# Patient Record
Sex: Female | Born: 1970 | Hispanic: Yes | Marital: Single | Smoking: Never smoker
Health system: Southern US, Community
[De-identification: ages and names within clinical notes are randomized; demographics above are authoritative.]

## PROBLEM LIST (undated history)

## (undated) DIAGNOSIS — K810 Acute cholecystitis: Secondary | ICD-10-CM

## (undated) HISTORY — PX: FINGER SURGERY: SHX640

## (undated) HISTORY — DX: Acute cholecystitis: K81.0

---

## 2007-08-24 ENCOUNTER — Inpatient Hospital Stay: Payer: Self-pay | Admitting: Unknown Physician Specialty

## 2007-08-24 ENCOUNTER — Emergency Department: Payer: Self-pay | Admitting: Emergency Medicine

## 2010-02-19 ENCOUNTER — Ambulatory Visit: Payer: Self-pay | Admitting: Family Medicine

## 2010-10-01 ENCOUNTER — Encounter: Payer: Self-pay | Admitting: Maternal & Fetal Medicine

## 2010-12-28 ENCOUNTER — Inpatient Hospital Stay: Payer: Self-pay

## 2011-12-20 ENCOUNTER — Emergency Department: Payer: Self-pay | Admitting: Emergency Medicine

## 2011-12-20 LAB — HCG, QUANTITATIVE, PREGNANCY: Beta Hcg, Quant.: 952 m[IU]/mL — ABNORMAL HIGH

## 2011-12-23 ENCOUNTER — Emergency Department: Payer: Self-pay | Admitting: Emergency Medicine

## 2011-12-23 LAB — HCG, QUANTITATIVE, PREGNANCY: Beta Hcg, Quant.: 141 m[IU]/mL — ABNORMAL HIGH

## 2016-08-07 ENCOUNTER — Inpatient Hospital Stay
Admission: EM | Admit: 2016-08-07 | Discharge: 2016-08-09 | DRG: 419 | Disposition: A | Discharge status: Home / Self Care | Payer: Self-pay | Attending: Surgery | Admitting: Surgery

## 2016-08-07 ENCOUNTER — Emergency Department: Payer: Self-pay

## 2016-08-07 ENCOUNTER — Encounter: Payer: Self-pay | Admitting: Emergency Medicine

## 2016-08-07 DIAGNOSIS — K8000 Calculus of gallbladder with acute cholecystitis without obstruction: Principal | ICD-10-CM | POA: Diagnosis present

## 2016-08-07 DIAGNOSIS — E669 Obesity, unspecified: Secondary | ICD-10-CM | POA: Diagnosis present

## 2016-08-07 DIAGNOSIS — Z6833 Body mass index (BMI) 33.0-33.9, adult: Secondary | ICD-10-CM

## 2016-08-07 DIAGNOSIS — R109 Unspecified abdominal pain: Secondary | ICD-10-CM

## 2016-08-07 DIAGNOSIS — K810 Acute cholecystitis: Secondary | ICD-10-CM

## 2016-08-07 LAB — COMPREHENSIVE METABOLIC PANEL
ALBUMIN: 4.4 g/dL (ref 3.5–5.0)
ALK PHOS: 80 U/L (ref 38–126)
ALT: 22 U/L (ref 14–54)
ANION GAP: 9 (ref 5–15)
AST: 23 U/L (ref 15–41)
BUN: 10 mg/dL (ref 6–20)
CALCIUM: 9 mg/dL (ref 8.9–10.3)
CHLORIDE: 101 mmol/L (ref 101–111)
CO2: 23 mmol/L (ref 22–32)
CREATININE: 0.54 mg/dL (ref 0.44–1.00)
GFR calc Af Amer: >60 mL/min (ref >60)
GFR calc non Af Amer: >60 mL/min (ref >60)
GLUCOSE: 138 mg/dL — AB (ref 65–99)
Potassium: 3.2 mmol/L — ABNORMAL LOW (ref 3.5–5.1)
SODIUM: 133 mmol/L — AB (ref 135–145)
Total Bilirubin: 0.9 mg/dL (ref 0.3–1.2)
Total Protein: 8.1 g/dL (ref 6.5–8.1)

## 2016-08-07 LAB — CBC
HCT: 38.6 % (ref 35.0–47.0)
HEMOGLOBIN: 14 g/dL (ref 12.0–16.0)
MCH: 31.6 pg (ref 26.0–34.0)
MCHC: 36.4 g/dL — ABNORMAL HIGH (ref 32.0–36.0)
MCV: 86.9 fL (ref 80.0–100.0)
Platelets: 307 10*3/uL (ref 150–440)
RBC: 4.44 MIL/uL (ref 3.80–5.20)
RDW: 14.1 % (ref 11.5–14.5)
WBC: 20.6 10*3/uL — ABNORMAL HIGH (ref 3.6–11.0)

## 2016-08-07 LAB — URINALYSIS, COMPLETE (UACMP) WITH MICROSCOPIC
BACTERIA UA: NONE SEEN
BILIRUBIN URINE: NEGATIVE
GLUCOSE, UA: NEGATIVE mg/dL
KETONES UR: NEGATIVE mg/dL
Leukocytes, UA: NEGATIVE
NITRITE: NEGATIVE
PROTEIN: 30 mg/dL — AB
Specific Gravity, Urine: 1.018 (ref 1.005–1.030)
pH: 6 (ref 5.0–8.0)

## 2016-08-07 LAB — POCT PREGNANCY, URINE: PREG TEST UR: NEGATIVE

## 2016-08-07 LAB — LIPASE, BLOOD: LIPASE: 14 U/L (ref 11–51)

## 2016-08-07 MED ORDER — MORPHINE SULFATE (PF) 4 MG/ML IV SOLN
4.0000 mg | Freq: Once | INTRAVENOUS | Status: AC
  Administered 2016-08-07: 4 mg via INTRAVENOUS

## 2016-08-07 MED ORDER — HEPARIN SODIUM (PORCINE) 5000 UNIT/ML IJ SOLN
5000.0000 [IU] | Freq: Three times a day (TID) | INTRAMUSCULAR | Status: DC
  Administered 2016-08-07 – 2016-08-09 (×5): 5000 [IU] via SUBCUTANEOUS
  Filled 2016-08-07 (×5): qty 1

## 2016-08-07 MED ORDER — MORPHINE SULFATE (PF) 2 MG/ML IV SOLN
2.0000 mg | INTRAVENOUS | Status: DC | PRN
  Administered 2016-08-07 – 2016-08-08 (×4): 2 mg via INTRAVENOUS
  Filled 2016-08-07 (×4): qty 1

## 2016-08-07 MED ORDER — MORPHINE SULFATE (PF) 4 MG/ML IV SOLN
INTRAVENOUS | Status: AC
  Administered 2016-08-07: 4 mg via INTRAVENOUS
  Filled 2016-08-07: qty 1

## 2016-08-07 MED ORDER — CEFAZOLIN SODIUM-DEXTROSE 1-4 GM/50ML-% IV SOLN
INTRAVENOUS | Status: AC
  Administered 2016-08-07: 1 g via INTRAVENOUS
  Filled 2016-08-07: qty 50

## 2016-08-07 MED ORDER — DEXTROSE IN LACTATED RINGERS 5 % IV SOLN
INTRAVENOUS | Status: DC
  Administered 2016-08-07 – 2016-08-09 (×5): via INTRAVENOUS

## 2016-08-07 MED ORDER — ONDANSETRON HCL 4 MG/2ML IJ SOLN
4.0000 mg | Freq: Once | INTRAMUSCULAR | Status: AC
  Administered 2016-08-07: 4 mg via INTRAVENOUS

## 2016-08-07 MED ORDER — CEFAZOLIN SODIUM-DEXTROSE 1-4 GM/50ML-% IV SOLN
1.0000 g | Freq: Four times a day (QID) | INTRAVENOUS | Status: DC
  Administered 2016-08-07 – 2016-08-08 (×4): 1 g via INTRAVENOUS
  Filled 2016-08-07 (×6): qty 50

## 2016-08-07 MED ORDER — ONDANSETRON HCL 4 MG/2ML IJ SOLN
INTRAMUSCULAR | Status: AC
  Administered 2016-08-07: 4 mg via INTRAVENOUS
  Filled 2016-08-07: qty 2

## 2016-08-07 MED ORDER — CEFTRIAXONE SODIUM IN DEXTROSE 20 MG/ML IV SOLN
1.0000 g | Freq: Once | INTRAVENOUS | Status: AC
  Administered 2016-08-07: 1 g via INTRAVENOUS
  Filled 2016-08-07: qty 50

## 2016-08-07 MED ORDER — HYDROCODONE-ACETAMINOPHEN 5-325 MG PO TABS
1.0000 | ORAL_TABLET | ORAL | Status: DC | PRN
  Administered 2016-08-09: 1 via ORAL
  Filled 2016-08-07: qty 1

## 2016-08-07 NOTE — H&P (Signed)
Catherine Kennedy is an 46 y.o. female.    Chief Complaint: Right upper quadrant pain  HPI: This is Spanish-speaking patient who is interviewed with the assistance of a certified interpreter. She describes one day of right upper quadrant pain it started yesterday is been unrelenting and has not improved. She had nausea but no emesis no fevers or chills and no jaundice or acholic stools she has never had this happen before. She has no family history of gallstone disease.  Patient's only surgical history is that of a left hand surgery she does not smoke or drink has no family history of gallbladder disease  History reviewed. No pertinent past medical history.  Past Surgical History:  Procedure Laterality Date  . FINGER SURGERY      No family history on file. Social History:  reports that she has never smoked. She has never used smokeless tobacco. She reports that she does not drink alcohol or use drugs.  Allergies: No Known Allergies   (Not in a hospital admission)   Review of Systems  Constitutional: Negative for chills and fever.  HENT: Negative.   Eyes: Negative.   Respiratory: Negative.   Cardiovascular: Negative.   Gastrointestinal: Positive for abdominal pain and nausea. Negative for blood in stool, constipation, diarrhea, heartburn and vomiting.  Genitourinary: Negative.   Musculoskeletal: Negative.   Skin: Negative.   Neurological: Negative.   Endo/Heme/Allergies: Negative.   Psychiatric/Behavioral: Negative.      Physical Exam:  BP 132/84   Pulse 87   Temp 99.7 F (37.6 C) (Oral)   Resp 18   Ht 4' 11.06" (1.5 m)   Wt 164 lb (74.4 kg)   LMP 08/02/2016 (Approximate)   SpO2 98%   BMI 33.06 kg/m   Physical Exam  Constitutional: She is oriented to person, place, and time and well-developed, well-nourished, and in no distress. No distress.  HENT:  Head: Normocephalic and atraumatic.  Eyes: Pupils are equal, round, and reactive to light. Right eye exhibits  no discharge. Left eye exhibits no discharge. No scleral icterus.  Neck: Normal range of motion.  Cardiovascular: Normal rate, regular rhythm and normal heart sounds.   Pulmonary/Chest: Effort normal. No respiratory distress. She has no wheezes. She has no rales.  Abdominal: Soft. She exhibits no distension. There is tenderness. There is no rebound and no guarding.  Tenderness in the right upper quadrant with questionable Murphy sign  Musculoskeletal: Normal range of motion. She exhibits deformity. She exhibits no edema or tenderness.  Scar the left hand and thumb  Lymphadenopathy:    She has no cervical adenopathy.  Neurological: She is alert and oriented to person, place, and time.  Skin: Skin is warm and dry. No rash noted. She is not diaphoretic. No erythema.  Psychiatric: Affect normal.  Vitals reviewed.       Results for orders placed or performed during the hospital encounter of 08/07/16 (from the past 48 hour(s))  Lipase, blood     Status: None   Collection Time: 08/07/16  2:46 PM  Result Value Ref Range   Lipase 14 11 - 51 U/L  Comprehensive metabolic panel     Status: Abnormal   Collection Time: 08/07/16  2:46 PM  Result Value Ref Range   Sodium 133 (L) 135 - 145 mmol/L   Potassium 3.2 (L) 3.5 - 5.1 mmol/L   Chloride 101 101 - 111 mmol/L   CO2 23 22 - 32 mmol/L   Glucose, Bld 138 (H) 65 - 99 mg/dL  BUN 10 6 - 20 mg/dL   Creatinine, Ser 0.54 0.44 - 1.00 mg/dL   Calcium 9.0 8.9 - 10.3 mg/dL   Total Protein 8.1 6.5 - 8.1 g/dL   Albumin 4.4 3.5 - 5.0 g/dL   AST 23 15 - 41 U/L   ALT 22 14 - 54 U/L   Alkaline Phosphatase 80 38 - 126 U/L   Total Bilirubin 0.9 0.3 - 1.2 mg/dL   GFR calc non Af Amer >60 >60 mL/min   GFR calc Af Amer >60 >60 mL/min    Comment: (NOTE) The eGFR has been calculated using the CKD EPI equation. This calculation has not been validated in all clinical situations. eGFR's persistently <60 mL/min signify possible Chronic Kidney Disease.     Anion gap 9 5 - 15  CBC     Status: Abnormal   Collection Time: 08/07/16  2:46 PM  Result Value Ref Range   WBC 20.6 (H) 3.6 - 11.0 K/uL   RBC 4.44 3.80 - 5.20 MIL/uL   Hemoglobin 14.0 12.0 - 16.0 g/dL    Comment: RESULT REPEATED AND VERIFIED   HCT 38.6 35.0 - 47.0 %   MCV 86.9 80.0 - 100.0 fL   MCH 31.6 26.0 - 34.0 pg   MCHC 36.4 (H) 32.0 - 36.0 g/dL   RDW 14.1 11.5 - 14.5 %   Platelets 307 150 - 440 K/uL  Urinalysis, Complete w Microscopic     Status: Abnormal   Collection Time: 08/07/16  2:46 PM  Result Value Ref Range   Color, Urine YELLOW (A) YELLOW   APPearance CLEAR (A) CLEAR   Specific Gravity, Urine 1.018 1.005 - 1.030   pH 6.0 5.0 - 8.0   Glucose, UA NEGATIVE NEGATIVE mg/dL   Hgb urine dipstick LARGE (A) NEGATIVE   Bilirubin Urine NEGATIVE NEGATIVE   Ketones, ur NEGATIVE NEGATIVE mg/dL   Protein, ur 30 (A) NEGATIVE mg/dL   Nitrite NEGATIVE NEGATIVE   Leukocytes, UA NEGATIVE NEGATIVE   RBC / HPF 6-30 0 - 5 RBC/hpf   WBC, UA 0-5 0 - 5 WBC/hpf   Bacteria, UA NONE SEEN NONE SEEN   Squamous Epithelial / LPF 0-5 (A) NONE SEEN   Mucous PRESENT   Pregnancy, urine POC     Status: None   Collection Time: 08/07/16  3:07 PM  Result Value Ref Range   Preg Test, Ur NEGATIVE NEGATIVE    Comment:        THE SENSITIVITY OF THIS METHODOLOGY IS >24 mIU/mL    US Abdomen Limited Ruq  Result Date: 08/07/2016 CLINICAL DATA:  46 year old female with right upper quadrant abdominal pain for 1 day. Initial encounter. EXAM: US ABDOMEN LIMITED - RIGHT UPPER QUADRANT COMPARISON:  None. FINDINGS: Gallbladder: A 2.2 cm calculus at the gallbladder neck is noted. Gallbladder wall thickening is present. No definite sonographic Murphy sign or pericholecystic fluid noted. Common bile duct: Diameter: 4.3 mm. There is no evidence of intrahepatic or extrahepatic biliary dilatation. Liver: No focal lesion identified. Within normal limits in parenchymal echogenicity. IMPRESSION: 2.2 cm calculus at the  gallbladder neck with gallbladder wall thickening- likely representing acute cholecystitis. No evidence of biliary dilatation. Electronically Signed   By: Margarette Canada M.D.   On: 08/07/2016 17:00     Assessment/Plan  Studies been personally reviewed. This is a patient with right upper quadrant pain and signs of acute cholecystitis with elevated white blood cell count and impacted stone with thickened gallbladder wall. I discussed with her her  family via an interpreter the options of observation and on oral antibiotics and oral analgesics as an outpatient with follow-up for future surgery or admission to the hospital tonight for surgery tomorrow morning she chooses to have her surgery this admission and be admitted tonight. I reviewed for her the options and the risks of bleeding infection recurrent symptoms failure to resolve her symptoms conversion to an open procedure bile duct damage bile duct leak. Again this was all reviewed with her interpreter and her family nor the patient had any questions. They agree to proceed  Florene Glen, MD, FACS

## 2016-08-07 NOTE — ED Provider Notes (Signed)
[facility] Emergency Department Provider Note   ____________________________________________   I have reviewed the triage vital signs and the nursing notes.   HISTORY  Chief Complaint Abdominal Pain   History limited by: Language [facility] Interpreter utilized   HPI Catherine Kennedy is a 46 y.o. female who presents to the emergency department today because of concerns for right-sided abdominal pain. The pain started 2 days ago. Located in the right upper abdomen. The pain started after the patient ate. She did have some vomiting yesterday. The pain has been constant since it started. The patient denies any fevers. Denies any change in defecation. No chest pain or shortness of breath.   History reviewed. No pertinent past medical history.  There are no active problems to display for this patient.   Past Surgical History:  Procedure Laterality Date  . FINGER SURGERY      Prior to Admission medications   Not on File    Allergies Patient has no known allergies.  No family history on file.  Social History Social History  Substance Use Topics  . Smoking status: Never Smoker  . Smokeless tobacco: Never Used  . Alcohol use No    Review of Systems Constitutional: No fever/chills Eyes: No visual changes. ENT: No sore throat. Cardiovascular: Denies chest pain. Respiratory: Denies shortness of breath. Gastrointestinal: Positive for right sided abdominal pain, positive for nausea and vomiting. Genitourinary: Negative for dysuria. Musculoskeletal: Negative for back pain. Skin: Negative for rash. Neurological: Negative for headaches, focal weakness or numbness.  ____________________________________________   PHYSICAL EXAM:  VITAL SIGNS: ED Triage Vitals  Enc Vitals Group     BP 08/07/16 1450 129/74     Pulse Rate 08/07/16 1450 84     Resp 08/07/16 1450 18     Temp 08/07/16 1450 99.7 F (37.6 C)     Temp Source 08/07/16 1450  Oral     SpO2 08/07/16 1450 98 %     Weight 08/07/16 1457 164 lb (74.4 kg)     Height 08/07/16 1457 4' 11.06" (1.5 m)   Constitutional: Alert and oriented. Well appearing and in no distress. Eyes: Conjunctivae are normal. Normal extraocular movements. ENT   Head: Normocephalic and atraumatic.   Nose: No congestion/rhinnorhea.   Mouth/Throat: Mucous membranes are moist.   Neck: No stridor. Hematological/Lymphatic/Immunilogical: No cervical lymphadenopathy. Cardiovascular: Normal rate, regular rhythm.  No murmurs, rubs, or gallops.  Respiratory: Normal respiratory effort without tachypnea nor retractions. Breath sounds are clear and equal bilaterally. No wheezes/rales/rhonchi. Gastrointestinal: Soft and  Tender to palpation in the right upper quadrant. No rebound. No guarding.  Genitourinary: Deferred Musculoskeletal: Normal range of motion in all extremities. No lower extremity edema. Neurologic:  Normal speech and language. No gross focal neurologic deficits are appreciated.  Skin:  Skin is warm, dry and intact. No rash noted. Psychiatric: Mood and affect are normal. Speech and behavior are normal. Patient exhibits appropriate insight and judgment.  ____________________________________________    LABS (pertinent positives/negatives)  Labs Reviewed  COMPREHENSIVE METABOLIC PANEL - Abnormal; Notable for the following:       Result Value   Sodium 133 (*)    Potassium 3.2 (*)    Glucose, Bld 138 (*)    All other components within normal limits  CBC - Abnormal; Notable for the following:    WBC 20.6 (*)    MCHC 36.4 (*)    All other components within normal limits  URINALYSIS, COMPLETE (UACMP) WITH MICROSCOPIC - Abnormal;  Notable for the following:    Color, Urine YELLOW (*)    APPearance CLEAR (*)    Hgb urine dipstick LARGE (*)    Protein, ur 30 (*)    Squamous Epithelial / LPF 0-5 (*)    All other components within normal limits  LIPASE, BLOOD  POCT PREGNANCY,  URINE     ____________________________________________   EKG  I, Phineas SemenGraydon Arne Schlender, attending physician, personally viewed and interpreted this EKG  EKG Time: 1502 Rate: 88 Rhythm: normal sinus rhythm Axis: normal Intervals: qtc 438 QRS: narrow ST changes: no st elevation, t wave inversion III, aVF, V3 Impression: abnormal ekg   ____________________________________________    RADIOLOGY  RUQ US IMPRESSION:  2.2 cm calculus at the gallbladder neck with gallbladder wall  thickening- likely representing acute cholecystitis. No evidence of  biliary dilatation.   ____________________________________________   PROCEDURES  Procedures  ____________________________________________   INITIAL IMPRESSION / ASSESSMENT AND PLAN / ED COURSE  Pertinent labs & imaging results that were available during my care of the patient were reviewed by me and considered in my medical decision making (see chart for details).  Patient presented to the emergency department today because of concerns for right sided abdominal pain per patient was tender in the right upper quadrant. Patient had a significant leukocytosis of 20 and ultrasound is concerning for acute cholecystitis. Patient will be admitted by surgical team.  ____________________________________________   FINAL CLINICAL IMPRESSION(S) / ED DIAGNOSES  Final diagnoses:  Abdominal pain  Acute cholecystitis     Note: This dictation was prepared with Dragon dictation. Any transcriptional errors that result from this process are unintentional     Phineas SemenGoodman, Sayre Mazor, MD 08/07/16 1737

## 2016-08-07 NOTE — ED Notes (Signed)
Patient transported to Ultrasound 

## 2016-08-07 NOTE — ED Triage Notes (Addendum)
Patient is complaining of right abdominal pain that began yesterday around 2pm.  Patient reports 1 episode of vomiting yesterday, none today.  Reports nausea.  Denies vomiting.  Patient states she still has her appendix and gallbladder.  Patient states occasionally pain radiates into patient's right shoulder.  Patient states pain is sharp like a wound.

## 2016-08-08 ENCOUNTER — Inpatient Hospital Stay: Payer: Self-pay | Admitting: Anesthesiology

## 2016-08-08 ENCOUNTER — Encounter
Admission: EM | Disposition: A | Discharge status: Home / Self Care | Payer: Self-pay | Source: Home / Self Care | Attending: Surgery

## 2016-08-08 ENCOUNTER — Encounter: Payer: Self-pay | Admitting: Anesthesiology

## 2016-08-08 DIAGNOSIS — K810 Acute cholecystitis: Secondary | ICD-10-CM

## 2016-08-08 HISTORY — DX: Acute cholecystitis: K81.0

## 2016-08-08 HISTORY — PX: CHOLECYSTECTOMY: SHX55

## 2016-08-08 LAB — CBC
HEMATOCRIT: 41 % (ref 35.0–47.0)
HEMOGLOBIN: 14.2 g/dL (ref 12.0–16.0)
MCH: 30.4 pg (ref 26.0–34.0)
MCHC: 34.6 g/dL (ref 32.0–36.0)
MCV: 87.7 fL (ref 80.0–100.0)
Platelets: 285 10*3/uL (ref 150–440)
RBC: 4.68 MIL/uL (ref 3.80–5.20)
RDW: 14.1 % (ref 11.5–14.5)
WBC: 27.2 10*3/uL — AB (ref 3.6–11.0)

## 2016-08-08 LAB — COMPREHENSIVE METABOLIC PANEL
ALBUMIN: 4 g/dL (ref 3.5–5.0)
ALK PHOS: 104 U/L (ref 38–126)
ALT: 58 U/L — ABNORMAL HIGH (ref 14–54)
ANION GAP: 6 (ref 5–15)
AST: 47 U/L — ABNORMAL HIGH (ref 15–41)
BILIRUBIN TOTAL: 1 mg/dL (ref 0.3–1.2)
BUN: 7 mg/dL (ref 6–20)
CALCIUM: 8.7 mg/dL — AB (ref 8.9–10.3)
CO2: 26 mmol/L (ref 22–32)
Chloride: 100 mmol/L — ABNORMAL LOW (ref 101–111)
Creatinine, Ser: 0.47 mg/dL (ref 0.44–1.00)
GFR calc non Af Amer: >60 mL/min (ref >60)
Glucose, Bld: 173 mg/dL — ABNORMAL HIGH (ref 65–99)
POTASSIUM: 3.5 mmol/L (ref 3.5–5.1)
Sodium: 132 mmol/L — ABNORMAL LOW (ref 135–145)
TOTAL PROTEIN: 8 g/dL (ref 6.5–8.1)

## 2016-08-08 LAB — SURGICAL PCR SCREEN
MRSA, PCR: NEGATIVE
Staphylococcus aureus: NEGATIVE

## 2016-08-08 SURGERY — LAPAROSCOPIC CHOLECYSTECTOMY
Anesthesia: General

## 2016-08-08 MED ORDER — FENTANYL CITRATE (PF) 100 MCG/2ML IJ SOLN
INTRAMUSCULAR | Status: AC
  Filled 2016-08-08: qty 2

## 2016-08-08 MED ORDER — LIDOCAINE HCL (CARDIAC) 20 MG/ML IV SOLN
INTRAVENOUS | Status: DC | PRN
  Administered 2016-08-08: 50 mg via INTRAVENOUS

## 2016-08-08 MED ORDER — SUGAMMADEX SODIUM 200 MG/2ML IV SOLN
INTRAVENOUS | Status: AC
  Filled 2016-08-08: qty 2

## 2016-08-08 MED ORDER — SUCCINYLCHOLINE CHLORIDE 20 MG/ML IJ SOLN
INTRAMUSCULAR | Status: AC
  Filled 2016-08-08: qty 1

## 2016-08-08 MED ORDER — PROPOFOL 10 MG/ML IV BOLUS
INTRAVENOUS | Status: DC | PRN
  Administered 2016-08-08: 170 mg via INTRAVENOUS

## 2016-08-08 MED ORDER — LIDOCAINE HCL (PF) 2 % IJ SOLN
INTRAMUSCULAR | Status: AC
  Filled 2016-08-08: qty 2

## 2016-08-08 MED ORDER — BUPIVACAINE HCL (PF) 0.5 % IJ SOLN
INTRAMUSCULAR | Status: DC | PRN
  Administered 2016-08-08: 10 mL

## 2016-08-08 MED ORDER — ONDANSETRON HCL 4 MG/2ML IJ SOLN
4.0000 mg | Freq: Four times a day (QID) | INTRAMUSCULAR | Status: DC | PRN
  Administered 2016-08-08: 4 mg via INTRAVENOUS
  Filled 2016-08-08: qty 2

## 2016-08-08 MED ORDER — ONDANSETRON HCL 4 MG/2ML IJ SOLN
INTRAMUSCULAR | Status: AC
  Filled 2016-08-08: qty 2

## 2016-08-08 MED ORDER — DEXAMETHASONE SODIUM PHOSPHATE 10 MG/ML IJ SOLN
INTRAMUSCULAR | Status: AC
  Filled 2016-08-08: qty 1

## 2016-08-08 MED ORDER — LIDOCAINE HCL (PF) 1 % IJ SOLN
INTRAMUSCULAR | Status: AC
  Filled 2016-08-08: qty 30

## 2016-08-08 MED ORDER — MIDAZOLAM HCL 2 MG/2ML IJ SOLN
INTRAMUSCULAR | Status: DC | PRN
  Administered 2016-08-08: 2 mg via INTRAVENOUS

## 2016-08-08 MED ORDER — DEXTROSE 5 % IV SOLN
2.0000 g | INTRAVENOUS | Status: DC
  Administered 2016-08-08: 2 g via INTRAVENOUS
  Filled 2016-08-08 (×2): qty 2

## 2016-08-08 MED ORDER — PHENYLEPHRINE HCL 10 MG/ML IJ SOLN
INTRAMUSCULAR | Status: DC | PRN
  Administered 2016-08-08 (×3): 100 ug via INTRAVENOUS

## 2016-08-08 MED ORDER — HYDROCODONE-ACETAMINOPHEN 5-325 MG PO TABS: 1.0000 | ORAL_TABLET | ORAL | 0 refills | Status: DC | PRN

## 2016-08-08 MED ORDER — ONDANSETRON HCL 4 MG/2ML IJ SOLN: 4.0000 mg | Freq: Once | INTRAMUSCULAR | Status: DC | PRN

## 2016-08-08 MED ORDER — DEXAMETHASONE SODIUM PHOSPHATE 10 MG/ML IJ SOLN
INTRAMUSCULAR | Status: DC | PRN
Start: 2016-08-08 — End: 2016-08-08
  Administered 2016-08-08: 5 mg via INTRAVENOUS

## 2016-08-08 MED ORDER — FENTANYL CITRATE (PF) 100 MCG/2ML IJ SOLN
INTRAMUSCULAR | Status: DC | PRN
  Administered 2016-08-08 (×6): 50 ug via INTRAVENOUS

## 2016-08-08 MED ORDER — MIDAZOLAM HCL 2 MG/2ML IJ SOLN
INTRAMUSCULAR | Status: AC
  Filled 2016-08-08: qty 2

## 2016-08-08 MED ORDER — LIDOCAINE HCL 1 % IJ SOLN
INTRAMUSCULAR | Status: DC | PRN
  Administered 2016-08-08: 10 mL

## 2016-08-08 MED ORDER — PROPOFOL 10 MG/ML IV BOLUS
INTRAVENOUS | Status: AC
  Filled 2016-08-08: qty 20

## 2016-08-08 MED ORDER — FENTANYL CITRATE (PF) 100 MCG/2ML IJ SOLN: 25.0000 ug | INTRAMUSCULAR | Status: DC | PRN

## 2016-08-08 MED ORDER — BUPIVACAINE-EPINEPHRINE (PF) 0.25% -1:200000 IJ SOLN
INTRAMUSCULAR | Status: AC
  Filled 2016-08-08: qty 30

## 2016-08-08 MED ORDER — BUPIVACAINE HCL (PF) 0.5 % IJ SOLN
INTRAMUSCULAR | Status: AC
  Filled 2016-08-08: qty 30

## 2016-08-08 MED ORDER — IBUPROFEN 400 MG PO TABS
600.0000 mg | ORAL_TABLET | Freq: Four times a day (QID) | ORAL | Status: DC | PRN
  Administered 2016-08-08: 600 mg via ORAL
  Filled 2016-08-08: qty 2

## 2016-08-08 MED ORDER — ROCURONIUM BROMIDE 100 MG/10ML IV SOLN
INTRAVENOUS | Status: AC
  Filled 2016-08-08: qty 1

## 2016-08-08 MED ORDER — LACTATED RINGERS IV SOLN
INTRAVENOUS | Status: DC | PRN
  Administered 2016-08-08 (×2): via INTRAVENOUS

## 2016-08-08 MED ORDER — KETOROLAC TROMETHAMINE 30 MG/ML IJ SOLN
INTRAMUSCULAR | Status: AC
  Filled 2016-08-08: qty 1

## 2016-08-08 MED ORDER — SUCCINYLCHOLINE CHLORIDE 20 MG/ML IJ SOLN
INTRAMUSCULAR | Status: DC | PRN
  Administered 2016-08-08: 100 mg via INTRAVENOUS

## 2016-08-08 MED ORDER — ROCURONIUM BROMIDE 100 MG/10ML IV SOLN
INTRAVENOUS | Status: DC | PRN
  Administered 2016-08-08: 10 mg via INTRAVENOUS
  Administered 2016-08-08: 30 mg via INTRAVENOUS
  Administered 2016-08-08: 10 mg via INTRAVENOUS

## 2016-08-08 MED ORDER — SUGAMMADEX SODIUM 200 MG/2ML IV SOLN
INTRAVENOUS | Status: DC | PRN
  Administered 2016-08-08: 150 mg via INTRAVENOUS

## 2016-08-08 SURGICAL SUPPLY — 48 items
ADHESIVE MASTISOL STRL (MISCELLANEOUS) IMPLANT
APPLIER CLIP ROT 10 11.4 M/L (STAPLE) ×3
BLADE SURG SZ11 CARB STEEL (BLADE) ×3 IMPLANT
CANISTER SUCT 1200ML W/VALVE (MISCELLANEOUS) ×3 IMPLANT
CATH CHOLANGI 4FR 420404F (CATHETERS) IMPLANT
CHLORAPREP W/TINT 26ML (MISCELLANEOUS) ×3 IMPLANT
CLIP APPLIE ROT 10 11.4 M/L (STAPLE) ×1 IMPLANT
CLOSURE WOUND 1/2 X4 (GAUZE/BANDAGES/DRESSINGS) ×1
CONRAY 60ML FOR OR (MISCELLANEOUS) IMPLANT
DERMABOND ADVANCED (GAUZE/BANDAGES/DRESSINGS) ×2
DERMABOND ADVANCED .7 DNX12 (GAUZE/BANDAGES/DRESSINGS) ×1 IMPLANT
DRAPE C-ARM XRAY 36X54 (DRAPES) IMPLANT
ELECT REM PT RETURN 9FT ADLT (ELECTROSURGICAL) ×3
ELECTRODE REM PT RTRN 9FT ADLT (ELECTROSURGICAL) ×1 IMPLANT
ENDOPOUCH RETRIEVER 10 (MISCELLANEOUS) ×3 IMPLANT
GAUZE SPONGE NON-WVN 2X2 STRL (MISCELLANEOUS) IMPLANT
GLOVE BIO SURGEON STRL SZ7 (GLOVE) ×12 IMPLANT
GLOVE BIO SURGEON STRL SZ8 (GLOVE) IMPLANT
GOWN STRL REUS W/ TWL LRG LVL3 (GOWN DISPOSABLE) ×2 IMPLANT
GOWN STRL REUS W/TWL LRG LVL3 (GOWN DISPOSABLE) ×4
GRASPER SUT TROCAR 14GX15 (MISCELLANEOUS) ×3 IMPLANT
HEMOSTAT ARISTA ABSORB 3G PWDR (MISCELLANEOUS) ×3 IMPLANT
IRRIGATION STRYKERFLOW (MISCELLANEOUS) ×1 IMPLANT
IRRIGATOR STRYKERFLOW (MISCELLANEOUS) ×3
IV CATH ANGIO 12GX3 LT BLUE (NEEDLE) IMPLANT
IV NS 1000ML (IV SOLUTION) ×2
IV NS 1000ML BAXH (IV SOLUTION) ×1 IMPLANT
JACKSON PRATT 10 (INSTRUMENTS) IMPLANT
KIT RM TURNOVER STRD PROC AR (KITS) ×3 IMPLANT
LABEL OR SOLS (LABEL) ×3 IMPLANT
NDL SAFETY 22GX1.5 (NEEDLE) ×3 IMPLANT
NEEDLE VERESS 14GA 120MM (NEEDLE) ×3 IMPLANT
NS IRRIG 500ML POUR BTL (IV SOLUTION) ×3 IMPLANT
PACK LAP CHOLECYSTECTOMY (MISCELLANEOUS) ×3 IMPLANT
SCISSORS METZENBAUM CVD 33 (INSTRUMENTS) ×3 IMPLANT
SLEEVE ENDOPATH XCEL 5M (ENDOMECHANICALS) ×6 IMPLANT
SPONGE LAP 18X18 5 PK (GAUZE/BANDAGES/DRESSINGS) IMPLANT
SPONGE VERSALON 2X2 STRL (MISCELLANEOUS)
SPONGE VERSALON 4X4 4PLY (MISCELLANEOUS) IMPLANT
STRIP CLOSURE SKIN 1/2X4 (GAUZE/BANDAGES/DRESSINGS) ×2 IMPLANT
SUT MNCRL 4-0 (SUTURE) ×2
SUT MNCRL 4-0 27XMFL (SUTURE) ×1
SUT VICRYL 0 AB UR-6 (SUTURE) ×3 IMPLANT
SUTURE MNCRL 4-0 27XMF (SUTURE) ×1 IMPLANT
SYR 20CC LL (SYRINGE) ×3 IMPLANT
TROCAR XCEL NON-BLD 11X100MML (ENDOMECHANICALS) ×3 IMPLANT
TROCAR XCEL NON-BLD 5MMX100MML (ENDOMECHANICALS) ×3 IMPLANT
TUBING INSUFFLATOR HI FLOW (MISCELLANEOUS) ×6 IMPLANT

## 2016-08-08 NOTE — Op Note (Signed)
SURGICAL OPERATIVE REPORT   DATE OF PROCEDURE: 08/08/2016  ATTENDING Surgeon(s): Ancil Linseyavis, Jason Evan, MD  ANESTHESIA: GETA  PRE-OPERATIVE DIAGNOSIS: Acute Cholecystitis (K80.00)  POST-OPERATIVE DIAGNOSIS: Acute Cholecystitis (K80.00)  PROCEDURE(S): (cpt's: 47562) 1.) Laparoscopic Cholecystectomy  INTRAOPERATIVE FINDINGS: Severe dense pericholecystic inflammation and distended gallbladder with >2 cm gallstone  INTRAOPERATIVE FLUIDS: 1000 mL crystalloid   ESTIMATED BLOOD LOSS: Minimal (<30 mL)   URINE OUTPUT: No foley  SPECIMENS: Gallbladder  IMPLANTS: None  DRAINS: None   COMPLICATIONS: None apparent   CONDITION AT COMPLETION: Hemodynamically stable and extubated  DISPOSITION: PACU   INDICATION(S) FOR PROCEDURE:  Patient is a 46 y.o. female who this admission presented with post-prandial RUQ > epigastric abdominal pain. Ultrasound suggested acute calculous cholecystitis with leukocytosis. All risks, benefits, and alternatives to above elective procedures were discussed with the patient, who elected to proceed, and informed consent was accordingly obtained at that time.   DETAILS OF PROCEDURE:  Patient was brought to the operating suite and appropriately identified. General anesthesia was administered along with peri-operative prophylactic IV antibiotics, and endotracheal intubation was performed by anesthesiologist, along with NG/OG tube for gastric decompression. In supine position, operative site was prepped and draped in usual sterile fashion, and following a brief time out, initial 5 mm incision was made in a natural skin crease just above the umbilicus. Fascia was then elevated, and a Verress needle was inserted and its proper position confirmed using aspiration and saline meniscus test.  Upon insufflation of the abdominal cavity with carbon dioxide to a well-tolerated pressure of 12-15 mmHg, 5 mm peri-umbilical port followed by laparoscope were inserted and used to  inspect the abdominal cavity and its contents with no injuries from insertion of the first trochar noted. Three additional trocars were inserted, one at the epigastric position (10 mm) and two along the Right costal margin (5 mm). The table was then placed in reverse Trendelenburg position with the Right side up. Severe dense adhesions between the gallbladder and omentum/duodenum/transverse colon were lysed using combined blunt and sharp dissection, along with selective electrocautery. The apex/dome of the gallbladder was grasped with an atraumatic grasper passed through the lateral port and retracted apically over the liver. The infundibulum was also grasped and retracted, exposing Calot's triangle. The peritoneum overlying the gallbladder infundibulum was incised and dissected free of surrounding dense inflammatory and peritoneal attachments, revealing the cystic duct and cystic artery after careful rather extensive dissection, which were clipped twice on the patient side and once on the gallbladder specimen side close to the gallbladder. The gallbladder was then dissected from its peritoneal attachments to the liver using electrocautery, and the gallbladder was placed into a laparoscopic specimen bag and removed from the abdominal cavity via the epigastric port site. Hemostasis and secure placement of clips were confirmed, and intra-peritoneal cavity was inspected with no additional findings. Endoclose/PMI laparoscopic fascial closure device was then used to re-approximate fascia at the 10 mm epigastric port site.  All ports were then removed under direct visualization, and abdominal cavity was desuflated. All port sites were irrigated/cleaned, additional local anesthetic was injected at each incision, 3-0 Vicryl was used to re-approximate dermis at 10 mm port site(s), and subcuticular 4-0 Monocryl suture was used to re-approximate skin. Skin was then cleaned, dried, and sterile skin glue was applied. Patient  was then safely able to be awakened, extubated, and transferred to PACU for post-operative monitoring and care.   I was present for all aspects of procedure, and there were no intra-operative complications apparent.

## 2016-08-08 NOTE — Anesthesia Postprocedure Evaluation (Signed)
Anesthesia Post Note  Patient: Lacinda Axonora Lopez Lopez  Procedure(s) Performed: Procedure(s) (LRB): LAPAROSCOPIC CHOLECYSTECTOMY (N/A)  Patient location during evaluation: PACU Anesthesia Type: General Level of consciousness: awake and alert and oriented Pain management: pain level controlled Vital Signs Assessment: post-procedure vital signs reviewed and stable Respiratory status: spontaneous breathing Cardiovascular status: blood pressure returned to baseline Anesthetic complications: no     Last Vitals:  Vitals:   08/08/16 1507 08/08/16 1510  BP: 105/71 102/68  Pulse: 82 86  Resp: 20 20  Temp: 36.6 C 36.6 C    Last Pain:  Vitals:   08/08/16 1432  TempSrc: Oral  PainSc:                  Rambo Sarafian

## 2016-08-08 NOTE — Progress Notes (Signed)
Patient transported via bed to the OR 

## 2016-08-08 NOTE — Progress Notes (Signed)
Patient nauseated and vomiting.  Patient felt dizzy while ambulating.  She has requested to spend the night and plan to discharge tomorrow

## 2016-08-08 NOTE — Anesthesia Procedure Notes (Signed)
Procedure Name: Intubation Performed by: Tametria Aho Pre-anesthesia Checklist: Patient identified, Patient being monitored, Timeout performed, Emergency Drugs available and Suction available Patient Re-evaluated:Patient Re-evaluated prior to inductionOxygen Delivery Method: Circle system utilized Preoxygenation: Pre-oxygenation with 100% oxygen Intubation Type: IV induction, Rapid sequence and Cricoid Pressure applied Laryngoscope Size: Miller and 2 Grade View: Grade I Tube type: Oral Tube size: 7.0 mm Number of attempts: 1 Airway Equipment and Method: Stylet Placement Confirmation: ETT inserted through vocal cords under direct vision,  positive ETCO2 and breath sounds checked- equal and bilateral Secured at: 21 cm Tube secured with: Tape Dental Injury: Teeth and Oropharynx as per pre-operative assessment        

## 2016-08-08 NOTE — Transfer of Care (Signed)
Immediate Anesthesia Transfer of Care Note  Patient: Catherine Kennedy  Procedure(s) Performed: Procedure(s): LAPAROSCOPIC CHOLECYSTECTOMY (N/A)  Patient Location: PACU  Anesthesia Type:General  Level of Consciousness: awake  Airway & Oxygen Therapy: Patient Spontanous Breathing and Patient connected to face mask oxygen  Post-op Assessment: Report given to RN and Post -op Vital signs reviewed and stable  Post vital signs: Reviewed  Last Vitals:  Vitals:   08/08/16 0457 08/08/16 1235  BP: 137/78 (!) 129/53  Pulse: 94   Resp: 18 18  Temp: 37.9 C 36.8 C    Last Pain:  Vitals:   08/08/16 0839  TempSrc:   PainSc: Asleep      Patients Stated Pain Goal: 2 (08/07/16 2323)  Complications: No apparent anesthesia complications

## 2016-08-08 NOTE — Discharge Instructions (Addendum)
Colecistectoma laparoscpica, cuidados posteriores (Laparoscopic Cholecystectomy, Care After) Lea esta informacin sobre cmo cuidarse despus del procedimiento. El mdico tambin podr darle instrucciones ms especficas. Comunquese con el mdico si tiene problemas o preguntas. QU ESPERAR DESPUS DEL PROCEDIMIENTO Despus del procedimiento, es comn tener los siguientes sntomas:  Dolor en los lugares de la incisin. Le darn analgsicos para Human resources officercontrolar el dolor.  Nuseas o vmitos leves.  Meteorismo y Designer, fashion/clothingposiblemente dolor en el hombro debido al gas que se us durante el procedimiento. INSTRUCCIONES PARA EL CUIDADO EN EL HOGAR Cuidados de la incisin  Siga las indicaciones del mdico acerca del cuidado de las incisiones. Haga lo siguiente:  BorgWarnerLvese las manos con agua y jabn antes de Multimedia programmercambiar las vendas (vendaje). Use desinfectante para manos si no dispone de [place] y [place]jabn.  Cambie el vendaje como se lo haya indicado el mdico.  No retire los puntos (suturas), el QUALCOMMadhesivo para la piel o las tiras [place]adhesivas. Es posible que estos deban quedar puestos en la piel durante 2semanas o ms tiempo. Si los bordes de las tiras [address] F[address] Rdadhesivas empiezan a despegarse y Scientific laboratory technicianenroscarse, puede recortar los que estn sueltos. No retire las tiras Agilent Technologiesadhesivas por completo a menos que el mdico se lo indique.  No tome baos de inmersin, no nade ni use el jacuzzi hasta que el mdico lo autorice. Pregntele al mdico si puede ducharse. Delle Reiningal vez solo le permitan tomar baos de [place] Shoresesponja.  Controle todos los das la zona de la incisin para detectar signos de infeccin. Est atento a los siguientes signos:  Aumento del enrojecimiento, de la hinchazn o del dolor.  Ms lquido [place] Janskyo sangre.  Calor.  Pus o mal olor. Actividad  No conduzca ni use maquinaria pesada mientras toma analgsicos recetados.  No levante ningn objeto que pese ms de 10libras (4,5kg) hasta que el mdico lo autorice.  No practique deportes de  contacto hasta que el mdico lo autorice.  No conduzca durante 24horas si le dieron un medicamento para ayudarlo a que se relaje (sedante).  Descanse todo lo que sea necesario. No regrese al Aleen Campitrabajo o a la escuela hasta que el mdico lo autorice. Instrucciones generales  Baxter Internationalome los medicamentos de venta libre y los recetados solamente como se lo haya indicado el mdico.  A fin de prevenir o tratar el estreimiento mientras toma analgsicos recetados, el mdico puede recomendarle lo siguiente:  Product managerBeber suficiente lquido para Pharmacologistmantener la orina clara o de color amarillo plido.  Tomar medicamentos recetados o de H. J. Heinzventa libre.  Consumir alimentos ricos en fibra, como frutas y verduras frescas, cereales integrales y frijoles.  Limitar el consumo de alimentos con alto contenido de grasas y azcares procesados, como alimentos fritos o dulces. SOLICITE ATENCIN MDICA SI:  Le aparece una erupcin cutnea.  Le aumenta el enrojecimiento, la hinchazn o el dolor alrededor de las incisiones.  Le sale ms lquido o sangre de las incisiones.  Las incisiones estn calientes al tacto.  Le sale pus o percibe mal olor de las incisiones.  Tiene fiebre.  Una o ms de las incisiones se abren. SOLICITE ATENCIN MDICA DE INMEDIATO SI:  Tiene dificultad para respirar.  Siente dolor en el pecho.  Siente ms dolor en los hombros.  Se desmaya o se siente mareado al ponerse de pie.  Siente un dolor intenso en el abdomen.  Tiene nuseas o vmitos durante ms de Civil engineer, contractingun da.  Siente dolor en la pierna. Esta informacin no tiene Theme park managercomo fin reemplazar el consejo del mdico. Asegrese de hacerle al mdico  cualquier pregunta que tenga. Document Released: 11/02/2010 Document Revised: 12/11/2014 Document Reviewed: 09/08/2015 Elsevier Interactive Patient Education  2017 [employer].

## 2016-08-08 NOTE — Progress Notes (Signed)
Patient returned recently from PACU.  Morphine given

## 2016-08-08 NOTE — Progress Notes (Signed)
Patient complaining of dizziness and nausea.  VSS.  Order received from Dr Earlene Plateravis for zofran

## 2016-08-08 NOTE — Anesthesia Preprocedure Evaluation (Signed)
Anesthesia Evaluation  Patient identified by MRN, date of birth, ID band Patient awake    Reviewed: Allergy & Precautions, NPO status , Patient's Chart, lab work & pertinent test results  Airway Mallampati: III  TM Distance: >3 FB     Dental  (+) Chipped   Pulmonary neg pulmonary ROS,    Pulmonary exam normal        Cardiovascular negative cardio ROS Normal cardiovascular exam     Neuro/Psych negative neurological ROS  negative psych ROS   GI/Hepatic Neg liver ROS, Acute gallbladder   Endo/Other  negative endocrine ROS  Renal/GU negative Renal ROS  negative genitourinary   Musculoskeletal negative musculoskeletal ROS (+)   Abdominal Normal abdominal exam  (+) - obese,   Peds negative pediatric ROS (+)  Hematology negative hematology ROS (+)   Anesthesia Other Findings   Reproductive/Obstetrics                            Anesthesia Physical Anesthesia Plan  ASA: II and emergent  Anesthesia Plan: General   Post-op Pain Management:    Induction: Intravenous and Rapid sequence  Airway Management Planned: Oral ETT  Additional Equipment:   Intra-op Plan:   Post-operative Plan: Extubation in OR  Informed Consent: I have reviewed the patients History and Physical, chart, labs and discussed the procedure including the risks, benefits and alternatives for the proposed anesthesia with the patient or authorized representative who has indicated his/her understanding and acceptance.   Dental advisory given  Plan Discussed with: CRNA and Surgeon  Anesthesia Plan Comments:         Anesthesia Quick Evaluation

## 2016-08-08 NOTE — Anesthesia Post-op Follow-up Note (Cosign Needed)
Anesthesia QCDR form completed.        

## 2016-08-09 DIAGNOSIS — K8000 Calculus of gallbladder with acute cholecystitis without obstruction: Principal | ICD-10-CM

## 2016-08-09 LAB — CBC
HCT: 35.4 % (ref 35.0–47.0)
Hemoglobin: 12.7 g/dL (ref 12.0–16.0)
MCH: 31.6 pg (ref 26.0–34.0)
MCHC: 35.9 g/dL (ref 32.0–36.0)
MCV: 87.8 fL (ref 80.0–100.0)
PLATELETS: 232 10*3/uL (ref 150–440)
RBC: 4.03 MIL/uL (ref 3.80–5.20)
RDW: 13.8 % (ref 11.5–14.5)
WBC: 19.3 10*3/uL — ABNORMAL HIGH (ref 3.6–11.0)

## 2016-08-09 LAB — COMPREHENSIVE METABOLIC PANEL
ALT: 113 U/L — ABNORMAL HIGH (ref 14–54)
ANION GAP: 7 (ref 5–15)
AST: 70 U/L — ABNORMAL HIGH (ref 15–41)
Albumin: 3.2 g/dL — ABNORMAL LOW (ref 3.5–5.0)
Alkaline Phosphatase: 150 U/L — ABNORMAL HIGH (ref 38–126)
BUN: 5 mg/dL — ABNORMAL LOW (ref 6–20)
CHLORIDE: 103 mmol/L (ref 101–111)
CO2: 28 mmol/L (ref 22–32)
Calcium: 8.8 mg/dL — ABNORMAL LOW (ref 8.9–10.3)
Creatinine, Ser: 0.43 mg/dL — ABNORMAL LOW (ref 0.44–1.00)
GFR calc Af Amer: >60 mL/min (ref >60)
GFR calc non Af Amer: >60 mL/min (ref >60)
Glucose, Bld: 115 mg/dL — ABNORMAL HIGH (ref 65–99)
POTASSIUM: 3.1 mmol/L — AB (ref 3.5–5.1)
SODIUM: 138 mmol/L (ref 135–145)
Total Bilirubin: 3.7 mg/dL — ABNORMAL HIGH (ref 0.3–1.2)
Total Protein: 7 g/dL (ref 6.5–8.1)

## 2016-08-09 MED ORDER — AMOXICILLIN-POT CLAVULANATE 875-125 MG PO TABS: 1.0000 | ORAL_TABLET | Freq: Two times a day (BID) | ORAL | 0 refills | Status: DC

## 2016-08-09 NOTE — Progress Notes (Signed)
1 Day Post-Op   Subjective:  Patient reports feeling much better today than she did before surgery. And images have some pain at her incision sites but otherwise doing much better. Tolerating a diet and having bowel function.  Vital signs in last 24 hours: Temp:  [97.4 F (36.3 C)-99.9 F (37.7 C)] 98.1 F (36.7 C) (05/07 0438) Pulse Rate:  [80-93] 80 (05/07 0438) Resp:  [12-20] 18 (05/07 0438) BP: (102-129)/(53-74) 108/63 (05/07 0438) SpO2:  [98 %-100 %] 98 % (05/07 0438) FiO2 (%):  [0 %-1 %] 0 % (05/06 1510) Last BM Date: 08/07/16  Intake/Output from previous day: 05/06 0701 - 05/07 0700 In: 3953.3 [P.O.:220; I.V.:3683.3; IV Piggyback:50] Out: 10 [Blood:10]  GI: Well approximated laparoscopic incision sites without evidence of erythema or drainage. Soft, nondistended. Appropriately tender to palpation in the right upper quadrant around the incision sites.  Lab Results:  CBC  Recent Labs  08/08/16 0436 08/09/16 0740  WBC 27.2* 19.3*  HGB 14.2 12.7  HCT 41.0 35.4  PLT 285 232   CMP     Component Value Date/Time   NA 138 08/09/2016 0740   K 3.1 (L) 08/09/2016 0740   CL 103 08/09/2016 0740   CO2 28 08/09/2016 0740   GLUCOSE 115 (H) 08/09/2016 0740   BUN 5 (L) 08/09/2016 0740   CREATININE 0.43 (L) 08/09/2016 0740   CALCIUM 8.8 (L) 08/09/2016 0740   PROT 7.0 08/09/2016 0740   ALBUMIN 3.2 (L) 08/09/2016 0740   AST 70 (H) 08/09/2016 0740   ALT 113 (H) 08/09/2016 0740   ALKPHOS 150 (H) 08/09/2016 0740   BILITOT 3.7 (H) 08/09/2016 0740   GFRNONAA >60 08/09/2016 0740   GFRAA >60 08/09/2016 0740   PT/INR No results for input(s): LABPROT, INR in the last 72 hours.  Studies/Results: [place] Abdomen Limited Ruq  Result Date: 08/07/2016 CLINICAL DATA:  46 year old female with right upper quadrant abdominal pain for 1 day. Initial encounter. EXAM: US ABDOMEN LIMITED - RIGHT UPPER QUADRANT COMPARISON:  None. FINDINGS: Gallbladder: A 2.2 cm calculus at the gallbladder neck is  noted. Gallbladder wall thickening is present. No definite sonographic Murphy sign or pericholecystic fluid noted. Common bile duct: Diameter: 4.3 mm. There is no evidence of intrahepatic or extrahepatic biliary dilatation. Liver: No focal lesion identified. Within normal limits in parenchymal echogenicity. IMPRESSION: 2.2 cm calculus at the gallbladder neck with gallbladder wall thickening- likely representing acute cholecystitis. No evidence of biliary dilatation. Electronically Signed   By: Harmon PierJeffrey  Hu M.D.   On: 08/07/2016 17:00    Assessment/Plan: 46 year old female status post laparoscopic cholecystectomy for acute cholecystitis. Clinically much better today. Continues to show a significant leukocytosis and the anticipated increase in her liver function tests. If patient desires to go home, she will be discharged on oral antibiotics for a ten-day therapy. She will need close follow-up in clinic. Encourage ambulation and incentive spirometer usage. Possible discharge home later today.   Ricarda Frameharles Arretta Toenjes, MD [facility] General Surgeon [facility]  Day [facility] [phone](7a-7p) [phone] Night [facility] [phone](7p-7a) [phone]  08/09/2016

## 2016-08-09 NOTE — Discharge Summary (Signed)
Patient ID: Catherine Kennedy MRN: [id number] DOB/AGE: 04-26-1970 46 y.o.  Admit date: 08/07/2016 Discharge date: 08/09/2016  Discharge Diagnoses:  Cholecysitis  Procedures Performed: Laparoscopic Cholecystecotmy  Discharged Condition: good  Hospital Course: Patient admitted with cholecystitis. Underwent a cholecystectomy on 5/6 and was able to be discharged home on oral antibiotics and pain medications on 5/7.   Discharge Orders: Discharge Instructions    Call MD for:  difficulty breathing, headache or visual disturbances    Complete by:  As directed    Call MD for:  hives    Complete by:  As directed    Call MD for:  persistant nausea and vomiting    Complete by:  As directed    Call MD for:  redness, tenderness, or signs of infection (pain, swelling, redness, odor or green/yellow discharge around incision site)    Complete by:  As directed    Call MD for:  severe uncontrolled pain    Complete by:  As directed    Call MD for:  temperature >100.4    Complete by:  As directed    Diet - low sodium heart healthy    Complete by:  As directed    Increase activity slowly    Complete by:  As directed       Disposition:   Discharge Medications: Allergies as of 08/09/2016   No Known Allergies     Medication List    TAKE these medications   amoxicillin-clavulanate 875-125 MG tablet Commonly known as:  AUGMENTIN Take 1 tablet by mouth 2 (two) times daily.   HYDROcodone-acetaminophen 5-325 MG tablet Commonly known as:  NORCO/VICODIN Take 1-2 tablets by mouth every 4 (four) hours as needed for severe pain.        Follwup: Follow-up Information    [facility] [place]. Schedule an appointment as soon as possible for a visit in 1 week(s).   Specialty:  General Surgery Contact information: [address] [place] [id number] [phone]          Signed: Ricarda FrameCharles Tove Wideman 08/09/2016, 11:05 AM

## 2016-08-09 NOTE — Care Management (Signed)
Patient status post cholecystectomy. Patient is self pay patient.  Patient to discharge with oral antibiotic and pain medication.  Patient will be provided goodrx coupons for both.  Antibiotic $25, and pain medication $20. Patient to be provided Spanish [facility].  Awaiting spanish interpreter.

## 2016-08-09 NOTE — Progress Notes (Signed)
Pt A and O x 4. VSS. Pt tolerating diet well. No complaints of pain or nausea. IV removed intact, prescriptions given. Pt voiced understanding of discharge instructions with no further questions. Interpreter used for discharge instructions. Pt discharged via wheelchair with axillary.

## 2016-08-10 LAB — SURGICAL PATHOLOGY

## 2016-08-12 ENCOUNTER — Ambulatory Visit (INDEPENDENT_AMBULATORY_CARE_PROVIDER_SITE_OTHER): Payer: Self-pay | Admitting: Surgery

## 2016-08-12 ENCOUNTER — Other Ambulatory Visit
Admission: RE | Admit: 2016-08-12 | Discharge: 2016-08-12 | Disposition: A | Discharge status: Home / Self Care | Payer: Self-pay | Source: Ambulatory Visit | Attending: Surgery | Admitting: Surgery

## 2016-08-12 ENCOUNTER — Encounter: Payer: Self-pay | Admitting: Surgery

## 2016-08-12 VITALS — BP 146/84 | HR 76 | Temp 98.2°F | Ht 59.0 in | Wt 161.6 lb

## 2016-08-12 DIAGNOSIS — R1011 Right upper quadrant pain: Secondary | ICD-10-CM | POA: Insufficient documentation

## 2016-08-12 LAB — COMPREHENSIVE METABOLIC PANEL
ALK PHOS: 313 U/L — AB (ref 38–126)
ALT: 111 U/L — AB (ref 14–54)
AST: 72 U/L — AB (ref 15–41)
Albumin: 3.3 g/dL — ABNORMAL LOW (ref 3.5–5.0)
Anion gap: 10 (ref 5–15)
BUN: 7 mg/dL (ref 6–20)
CALCIUM: 8.8 mg/dL — AB (ref 8.9–10.3)
CHLORIDE: 99 mmol/L — AB (ref 101–111)
CO2: 26 mmol/L (ref 22–32)
CREATININE: 0.63 mg/dL (ref 0.44–1.00)
GFR calc non Af Amer: >60 mL/min (ref >60)
Glucose, Bld: 160 mg/dL — ABNORMAL HIGH (ref 65–99)
Potassium: 3.4 mmol/L — ABNORMAL LOW (ref 3.5–5.1)
SODIUM: 135 mmol/L (ref 135–145)
Total Bilirubin: 1.4 mg/dL — ABNORMAL HIGH (ref 0.3–1.2)
Total Protein: 7.4 g/dL (ref 6.5–8.1)

## 2016-08-12 LAB — CBC WITH DIFFERENTIAL/PLATELET
Basophils Absolute: 0.1 10*3/uL (ref 0–0.1)
Basophils Relative: 1 %
EOS ABS: 0.1 10*3/uL (ref 0–0.7)
Eosinophils Relative: 1 %
HCT: 36.9 % (ref 35.0–47.0)
HEMOGLOBIN: 13.2 g/dL (ref 12.0–16.0)
LYMPHS ABS: 1.7 10*3/uL (ref 1.0–3.6)
LYMPHS PCT: 17 %
MCH: 31 pg (ref 26.0–34.0)
MCHC: 35.8 g/dL (ref 32.0–36.0)
MCV: 86.6 fL (ref 80.0–100.0)
Monocytes Absolute: 1 10*3/uL — ABNORMAL HIGH (ref 0.2–0.9)
Monocytes Relative: 10 %
NEUTROS PCT: 71 %
Neutro Abs: 7.2 10*3/uL — ABNORMAL HIGH (ref 1.4–6.5)
Platelets: 302 10*3/uL (ref 150–440)
RBC: 4.26 MIL/uL (ref 3.80–5.20)
RDW: 13.7 % (ref 11.5–14.5)
WBC: 10 10*3/uL (ref 3.6–11.0)

## 2016-08-12 NOTE — Progress Notes (Signed)
Surgical Clinic Progress/Follow-up Note   HPI:  46 y.o. yo Female presents to clinic for post-op follow-up evaluation 4 days s/p laparoscopic cholecystectomy. Patient reports frequent nausea and has been eating smaller meals to minimize feeling nausea, but she describes only minimal abdominal pain just to the Right of her epigastric incision and denies any severe pain, emesis, diarrhea/loose BM's, fever/chills, CP, or SOB.  Review of Systems:  Constitutional: denies any other weight loss, fever, chills, or sweats  Eyes: denies any other vision changes, history of eye injury  ENT: denies sore throat, hearing problems  Respiratory: denies shortness of breath, wheezing  Cardiovascular: denies chest pain, palpitations  Gastrointestinal: abdominal pain, N/V, and bowel function as per HPI Musculoskeletal: denies any other joint pains or cramps  Skin: Denies any other rashes or skin discolorations  Neurological: denies any other headache, dizziness, weakness  Psychiatric: denies any other depression, anxiety  All other review of systems: otherwise negative   Vital Signs:  BP (!) 146/84   Pulse 76   Temp 98.2 F (36.8 C) (Oral)   Ht 4\' 11"  (1.499 m)   Wt 161 lb 9.6 oz (73.3 kg)   LMP 08/02/2016 (Approximate) Comment: preg test neg  BMI 32.64 kg/m    Physical Exam:  Constitutional:  -- Overweight body habitus  -- Awake, alert, and oriented x3 -- No appreciable jaundice Eyes:  -- Pupils equally round and reactive to light  -- +Scleral icterus Ear, nose, throat:  -- No jugular venous distension  -- No nasal drainage, bleeding Pulmonary:  -- No crackles  -- No dullness to percussion  Cardiovascular:  -- S1, S2 present  -- No pericardial rubs  Gastrointestinal:  -- Soft, mild peri-incisional tenderness to palpation (immediately to Right of epigastric incision), nondistended, no guarding/rebound  -- Post-laparoscopic surgical incisions well-approximated without any erythema or  drainage -- No abdominal masses appreciated, pulsatile or otherwise  Musculoskeletal / Integumentary:  -- Wounds or skin discoloration: None except post-surgical wounds as above  -- Extremities: B/L UE and LE FROM, hands and feet warm, no edema  Neurologic:  -- Motor function: intact and symmetric  -- Sensation: intact and symmetric   Laboratory studies:  CBC Latest Ref Rng & Units 08/12/2016 08/09/2016 08/08/2016  WBC 3.6 - 11.0 K/uL 10.0 19.3(H) 27.2(H)  Hemoglobin 12.0 - 16.0 g/dL 40.913.2 81.112.7 91.414.2  Hematocrit 35.0 - 47.0 % 36.9 35.4 41.0  Platelets 150 - 440 K/uL 302 232 285   CMP Latest Ref Rng & Units 08/12/2016 08/09/2016 08/08/2016  Glucose 65 - 99 mg/dL 782(N160(H) 562(Z115(H) 308(M173(H)  BUN 6 - 20 mg/dL 7 5(L) 7  Creatinine 5.780.44 - 1.00 mg/dL 4.690.63 6.29(B0.43(L) 2.840.47  Sodium 135 - 145 mmol/L 135 138 132(L)  Potassium 3.5 - 5.1 mmol/L 3.4(L) 3.1(L) 3.5  Chloride 101 - 111 mmol/L 99(L) 103 100(L)  CO2 22 - 32 mmol/L 26 28 26   Calcium 8.9 - 10.3 mg/dL 1.3(K8.8(L) 4.4(W8.8(L) 1.0(U8.7(L)  Total Protein 6.5 - 8.1 g/dL 7.4 7.0 8.0  Total Bilirubin 0.3 - 1.2 mg/dL 7.2(Z1.4(H) 3.6(U3.7(H) 1.0  Alkaline Phos 38 - 126 U/L 313(H) 150(H) 104  AST 15 - 41 U/L 72(H) 70(H) 47(H)  ALT 14 - 54 U/L 111(H) 113(H) 58(H)    Imaging: No new pertinent imaging studies available for review   Assessment:  46 y.o. yo Female with a problem list including...  Patient Active Problem List   Diagnosis Date Noted  . Acute cholecystitis 08/07/2016    presents to clinic for post-op follow-up  evaluation 4 days s/p laparoscopic cholecystectomy for acute cholecystitis with substantial pericholecystic inflammation and a 2.3 cm gallstone requiring manipulation from the infundibulum up towards the gallbladder body to grasp the gallbladder intra-operatively and complete the surgery, complicated by comorbidities including obesity (BMI 33).  Plan:   - repeat CBC and CMP reviewed   - MRCP tomorrow to evaluate for likely passing retained gallstone  - follow-up and  further management to be determined after MRCP  All of the above recommendations were discussed with the patient and patient's daughter, and all of patient's and family's questions were answered to their expressed satisfaction.  -- Scherrie Gerlach Earlene Plater, MD, RPVI [place]: [facility] General [facility] - Partnering for exceptional care. Office: [phone]

## 2016-08-12 NOTE — Patient Instructions (Signed)
Te enviaremos al [facility] hoy para Labs. Le llamar con los resultados de esto y Lexicographerle explicar el siguiente paso en el plan de atencin.   Cmo llegar a [facility]: cuando salga de nuestra oficina, vaya a la derecha. Ve todo el camino Dollar Generalhasta el final del pasillo. Tendrs una pared morada frente a ti. Ahora tendr un tnel [facility] el hospital a su izquierda. Pasa por este tnel y los ascensores estarn a tu izquierda. Baje al primer piso y gire ligeramente a la izquierda. El Advertising account plannerprimer escritorio en el lado derecho es el mostrador de Engineer, maintenance (IT)registro.

## 2016-08-14 ENCOUNTER — Other Ambulatory Visit: Payer: Self-pay | Admitting: Surgery

## 2016-08-14 ENCOUNTER — Ambulatory Visit (HOSPITAL_COMMUNITY)
Admission: RE | Admit: 2016-08-14 | Discharge: 2016-08-14 | Disposition: A | Discharge status: Home / Self Care | Payer: Self-pay | Source: Ambulatory Visit | Attending: Surgery | Admitting: Surgery

## 2016-08-14 DIAGNOSIS — R1011 Right upper quadrant pain: Secondary | ICD-10-CM | POA: Insufficient documentation

## 2016-08-14 DIAGNOSIS — Z9049 Acquired absence of other specified parts of digestive tract: Secondary | ICD-10-CM | POA: Insufficient documentation

## 2016-08-14 MED ORDER — GADOBENATE DIMEGLUMINE 529 MG/ML IV SOLN: 15.0000 mL | Freq: Once | INTRAVENOUS | Status: DC | PRN

## 2016-08-16 ENCOUNTER — Telehealth: Payer: Self-pay

## 2016-08-16 ENCOUNTER — Other Ambulatory Visit: Payer: Self-pay

## 2016-08-16 ENCOUNTER — Other Ambulatory Visit
Admission: RE | Admit: 2016-08-16 | Discharge: 2016-08-16 | Disposition: A | Discharge status: Home / Self Care | Payer: Self-pay | Source: Ambulatory Visit | Attending: General Surgery | Admitting: General Surgery

## 2016-08-16 DIAGNOSIS — R1011 Right upper quadrant pain: Secondary | ICD-10-CM

## 2016-08-16 DIAGNOSIS — G8929 Other chronic pain: Secondary | ICD-10-CM

## 2016-08-16 LAB — COMPREHENSIVE METABOLIC PANEL
ALT: 85 U/L — ABNORMAL HIGH (ref 14–54)
ANION GAP: 7 (ref 5–15)
AST: 27 U/L (ref 15–41)
Albumin: 3.6 g/dL (ref 3.5–5.0)
Alkaline Phosphatase: 273 U/L — ABNORMAL HIGH (ref 38–126)
BUN: 13 mg/dL (ref 6–20)
CHLORIDE: 101 mmol/L (ref 101–111)
CO2: 27 mmol/L (ref 22–32)
Calcium: 9.3 mg/dL (ref 8.9–10.3)
Creatinine, Ser: 0.51 mg/dL (ref 0.44–1.00)
Glucose, Bld: 90 mg/dL (ref 65–99)
POTASSIUM: 4.4 mmol/L (ref 3.5–5.1)
Sodium: 135 mmol/L (ref 135–145)
TOTAL PROTEIN: 8.4 g/dL — AB (ref 6.5–8.1)
Total Bilirubin: 0.7 mg/dL (ref 0.3–1.2)

## 2016-08-16 LAB — CBC WITH DIFFERENTIAL/PLATELET
BASOS ABS: 0.1 10*3/uL (ref 0–0.1)
Basophils Relative: 1 %
Eosinophils Absolute: 0.2 10*3/uL (ref 0–0.7)
Eosinophils Relative: 2 %
HCT: 37.5 % (ref 35.0–47.0)
Hemoglobin: 13.4 g/dL (ref 12.0–16.0)
LYMPHS PCT: 19 %
Lymphs Abs: 2.1 10*3/uL (ref 1.0–3.6)
MCH: 31.5 pg (ref 26.0–34.0)
MCHC: 35.9 g/dL (ref 32.0–36.0)
MCV: 87.9 fL (ref 80.0–100.0)
Monocytes Absolute: 0.9 10*3/uL (ref 0.2–0.9)
Monocytes Relative: 8 %
NEUTROS PCT: 70 %
Neutro Abs: 7.7 10*3/uL — ABNORMAL HIGH (ref 1.4–6.5)
Platelets: 416 10*3/uL (ref 150–440)
RBC: 4.26 MIL/uL (ref 3.80–5.20)
RDW: 14.2 % (ref 11.5–14.5)
WBC: 10.9 10*3/uL (ref 3.6–11.0)

## 2016-08-16 NOTE — Telephone Encounter (Signed)
Called patient but spoke with daughter Bosie ClosJudith. I gave her  her mother's MRCP results. I also told her that we needed her to go to the lab to have blood test to check it again. After we receive the results, I would call her. If by any chance her results show that it's elevated, then she will be admitted. Bosie ClosJudith understood. I also asked her how her mother was doing. She stated that she was doing better. I asked if her jaundice on her eyes had gotten better and she stated that they were. I told her that I would let the doctor know.  Labs were ordered.

## 2016-08-19 ENCOUNTER — Ambulatory Visit (INDEPENDENT_AMBULATORY_CARE_PROVIDER_SITE_OTHER): Payer: Self-pay | Admitting: Surgery

## 2016-08-19 ENCOUNTER — Encounter: Payer: Self-pay | Admitting: Surgery

## 2016-08-19 ENCOUNTER — Other Ambulatory Visit
Admission: RE | Admit: 2016-08-19 | Discharge: 2016-08-19 | Disposition: A | Discharge status: Home / Self Care | Payer: Self-pay | Source: Ambulatory Visit | Attending: General Surgery | Admitting: General Surgery

## 2016-08-19 ENCOUNTER — Ambulatory Visit
Admission: RE | Admit: 2016-08-19 | Discharge: 2016-08-19 | Disposition: A | Discharge status: Home / Self Care | Payer: Self-pay | Source: Ambulatory Visit | Attending: Surgery | Admitting: Surgery

## 2016-08-19 VITALS — BP 109/74 | HR 87 | Temp 97.7°F | Wt 161.0 lb

## 2016-08-19 DIAGNOSIS — R1013 Epigastric pain: Secondary | ICD-10-CM

## 2016-08-19 DIAGNOSIS — Z09 Encounter for follow-up examination after completed treatment for conditions other than malignant neoplasm: Secondary | ICD-10-CM

## 2016-08-19 DIAGNOSIS — G8929 Other chronic pain: Secondary | ICD-10-CM | POA: Insufficient documentation

## 2016-08-19 DIAGNOSIS — Z9049 Acquired absence of other specified parts of digestive tract: Secondary | ICD-10-CM | POA: Insufficient documentation

## 2016-08-19 DIAGNOSIS — R1011 Right upper quadrant pain: Secondary | ICD-10-CM | POA: Insufficient documentation

## 2016-08-19 DIAGNOSIS — R935 Abnormal findings on diagnostic imaging of other abdominal regions, including retroperitoneum: Secondary | ICD-10-CM | POA: Insufficient documentation

## 2016-08-19 LAB — COMPREHENSIVE METABOLIC PANEL
ALT: 49 U/L (ref 14–54)
AST: 23 U/L (ref 15–41)
Albumin: 3.7 g/dL (ref 3.5–5.0)
Alkaline Phosphatase: 230 U/L — ABNORMAL HIGH (ref 38–126)
Anion gap: 7 (ref 5–15)
BUN: 12 mg/dL (ref 6–20)
CO2: 28 mmol/L (ref 22–32)
CREATININE: 0.5 mg/dL (ref 0.44–1.00)
Calcium: 9.5 mg/dL (ref 8.9–10.3)
Chloride: 101 mmol/L (ref 101–111)
GFR calc non Af Amer: >60 mL/min (ref >60)
Glucose, Bld: 106 mg/dL — ABNORMAL HIGH (ref 65–99)
Potassium: 4.7 mmol/L (ref 3.5–5.1)
SODIUM: 136 mmol/L (ref 135–145)
Total Bilirubin: 0.6 mg/dL (ref 0.3–1.2)
Total Protein: 8 g/dL (ref 6.5–8.1)

## 2016-08-19 LAB — CBC WITH DIFFERENTIAL/PLATELET
Basophils Absolute: 0.1 10*3/uL (ref 0–0.1)
Basophils Relative: 1 %
EOS PCT: 1 %
Eosinophils Absolute: 0.2 10*3/uL (ref 0–0.7)
HCT: 38.3 % (ref 35.0–47.0)
Hemoglobin: 13.3 g/dL (ref 12.0–16.0)
LYMPHS ABS: 1.8 10*3/uL (ref 1.0–3.6)
LYMPHS PCT: 13 %
MCH: 30.5 pg (ref 26.0–34.0)
MCHC: 34.6 g/dL (ref 32.0–36.0)
MCV: 88 fL (ref 80.0–100.0)
MONO ABS: 0.6 10*3/uL (ref 0.2–0.9)
Monocytes Relative: 5 %
Neutro Abs: 10.9 10*3/uL — ABNORMAL HIGH (ref 1.4–6.5)
Neutrophils Relative %: 80 %
PLATELETS: 481 10*3/uL — AB (ref 150–440)
RBC: 4.35 MIL/uL (ref 3.80–5.20)
RDW: 14.7 % — ABNORMAL HIGH (ref 11.5–14.5)
WBC: 13.6 10*3/uL — ABNORMAL HIGH (ref 3.6–11.0)

## 2016-08-19 NOTE — Patient Instructions (Addendum)
Yo la voy a llamar pronto cuando me llamen con la cita.

## 2016-08-19 NOTE — Progress Notes (Signed)
Outpatient Surgical Follow Up  08/19/2016  Catherine Kennedy is an 46 y.o. female.   Chief Complaint  Patient presents with  . Routine Post Op    Post Op: Laparoscopic Cholecystectomy Dr. Rosana Hoes 08/08/2016-Blood work prior to appt.    HPI: s/p lap chole, some mild pain epigastric port, taking some PO, no biliary obstruction wbc mildly elevated and improving alk phos but still elevated. MRCP reviewed there is a collection next to the GB fossa. Initially thought to be seroma .  She is taking PO, and has some intermittent pain. Overall slowly improving.  Past Medical History:  Diagnosis Date  . Acute cholecystitis 08/08/2016    Past Surgical History:  Procedure Laterality Date  . CHOLECYSTECTOMY N/A 08/08/2016   Procedure: LAPAROSCOPIC CHOLECYSTECTOMY;  Surgeon: Vickie Epley, MD;  Location: [facility] ORS;  Service: General;  Laterality: N/A;  . FINGER SURGERY      Family History  Problem Relation Age of Onset  . Diabetes Father     Social History:  reports that she has never smoked. She has never used smokeless tobacco. She reports that she does not drink alcohol or use drugs.  Allergies: No Known Allergies  Medications reviewed.    ROS Full ROS performed and is otherwise negative other than what is stated in HPI   BP 109/74   Pulse 87   Temp 97.7 F (36.5 C) (Oral)   Wt 73 kg (161 lb)   LMP 08/02/2016 (Approximate) Comment: preg test neg  BMI 32.52 kg/m   Physical Exam NAD, non toxic ABd; soft, Mild TTP epigastric area around port incision . No evidence of wound infection or peritonitis.   Results for orders placed or performed during the hospital encounter of 08/19/16 (from the past 48 hour(s))  CBC with Differential     Status: Abnormal   Collection Time: 08/19/16  2:50 PM  Result Value Ref Range   WBC 13.6 (H) 3.6 - 11.0 K/uL   RBC 4.35 3.80 - 5.20 MIL/uL   Hemoglobin 13.3 12.0 - 16.0 g/dL   HCT 38.3 35.0 - 47.0 %   MCV 88.0 80.0 - 100.0 fL   MCH 30.5 26.0  - 34.0 pg   MCHC 34.6 32.0 - 36.0 g/dL   RDW 14.7 (H) 11.5 - 14.5 %   Platelets 481 (H) 150 - 440 K/uL   Neutrophils Relative % 80 %   Neutro Abs 10.9 (H) 1.4 - 6.5 K/uL   Lymphocytes Relative 13 %   Lymphs Abs 1.8 1.0 - 3.6 K/uL   Monocytes Relative 5 %   Monocytes Absolute 0.6 0.2 - 0.9 K/uL   Eosinophils Relative 1 %   Eosinophils Absolute 0.2 0 - 0.7 K/uL   Basophils Relative 1 %   Basophils Absolute 0.1 0 - 0.1 K/uL  Comprehensive metabolic panel     Status: Abnormal   Collection Time: 08/19/16  2:50 PM  Result Value Ref Range   Sodium 136 135 - 145 mmol/L   Potassium 4.7 3.5 - 5.1 mmol/L   Chloride 101 101 - 111 mmol/L   CO2 28 22 - 32 mmol/L   Glucose, Bld 106 (H) 65 - 99 mg/dL   BUN 12 6 - 20 mg/dL   Creatinine, Ser 0.50 0.44 - 1.00 mg/dL   Calcium 9.5 8.9 - 10.3 mg/dL   Total Protein 8.0 6.5 - 8.1 g/dL   Albumin 3.7 3.5 - 5.0 g/dL   AST 23 15 - 41 U/L   ALT 49 14 -  54 U/L   Alkaline Phosphatase 230 (H) 38 - 126 U/L   Total Bilirubin 0.6 0.3 - 1.2 mg/dL   GFR calc non Af Amer >60 >60 mL/min   GFR calc Af Amer >60 >60 mL/min    Comment: (NOTE) The eGFR has been calculated using the CKD EPI equation. This calculation has not been validated in all clinical situations. eGFR's persistently <60 mL/min signify possible Chronic Kidney Disease.    Anion gap 7 5 - 15   No results found.  Assessment/Plan:  1. Abdominal pain, epigastric - NM HEPATOBILIARY INCLUDING GB; Future - US Abdomen Limited RUQ; Future Patient status post laparoscopic cholecystectomy by Dr. Rosana Hoes with some collection discovered an MRCP. She continues to have and should've her alkaline phosphatase and her white count is slightly elevated. Clinically she is slowly improving but not completely symptom-free. Due to persistent increase in the alkaline phosphatase and mild elevation of the white count and was present thing to do is to rule out either an abscess or bile leak. We'll schedule her for an  ultrasound and right upper quadrant as well as a HIDA scan to rule out a bile leak. She will come back to see [place] tomorrow and at that time we'll determine if she will need any further interventions. Currently she is not toxic and does not need any emergent surgical revision at this time  Caroleen Hamman, MD [place] Surgeon

## 2016-08-20 ENCOUNTER — Ambulatory Visit: Payer: Self-pay

## 2016-08-20 ENCOUNTER — Inpatient Hospital Stay
Admission: RE | Admit: 2016-08-20 | Discharge: 2016-08-22 | DRG: 395 | Disposition: A | Discharge status: Home / Self Care | Payer: Self-pay | Source: Ambulatory Visit | Attending: General Surgery | Admitting: General Surgery

## 2016-08-20 ENCOUNTER — Other Ambulatory Visit: Payer: Self-pay | Admitting: General Surgery

## 2016-08-20 DIAGNOSIS — R1013 Epigastric pain: Secondary | ICD-10-CM

## 2016-08-20 DIAGNOSIS — K9189 Other postprocedural complications and disorders of digestive system: Principal | ICD-10-CM | POA: Diagnosis present

## 2016-08-20 DIAGNOSIS — K838 Other specified diseases of biliary tract: Secondary | ICD-10-CM

## 2016-08-20 DIAGNOSIS — Y838 Other surgical procedures as the cause of abnormal reaction of the patient, or of later complication, without mention of misadventure at the time of the procedure: Secondary | ICD-10-CM | POA: Diagnosis present

## 2016-08-20 DIAGNOSIS — K8050 Calculus of bile duct without cholangitis or cholecystitis without obstruction: Secondary | ICD-10-CM

## 2016-08-20 LAB — CBC WITH DIFFERENTIAL/PLATELET
Basophils Absolute: 0.1 10*3/uL (ref 0–0.1)
Basophils Relative: 1 %
EOS PCT: 1 %
Eosinophils Absolute: 0.1 10*3/uL (ref 0–0.7)
HCT: 39.5 % (ref 35.0–47.0)
Hemoglobin: 14.1 g/dL (ref 12.0–16.0)
LYMPHS ABS: 1.8 10*3/uL (ref 1.0–3.6)
LYMPHS PCT: 20 %
MCH: 31.5 pg (ref 26.0–34.0)
MCHC: 35.8 g/dL (ref 32.0–36.0)
MCV: 88 fL (ref 80.0–100.0)
MONO ABS: 0.5 10*3/uL (ref 0.2–0.9)
Monocytes Relative: 5 %
Neutro Abs: 6.5 10*3/uL (ref 1.4–6.5)
Neutrophils Relative %: 73 %
PLATELETS: 496 10*3/uL — AB (ref 150–440)
RBC: 4.49 MIL/uL (ref 3.80–5.20)
RDW: 14.6 % — AB (ref 11.5–14.5)
WBC: 9 10*3/uL (ref 3.6–11.0)

## 2016-08-20 LAB — COMPREHENSIVE METABOLIC PANEL
ALT: 51 U/L (ref 14–54)
AST: 27 U/L (ref 15–41)
Albumin: 4 g/dL (ref 3.5–5.0)
Alkaline Phosphatase: 234 U/L — ABNORMAL HIGH (ref 38–126)
Anion gap: 9 (ref 5–15)
BUN: 16 mg/dL (ref 6–20)
CHLORIDE: 104 mmol/L (ref 101–111)
CO2: 26 mmol/L (ref 22–32)
Calcium: 9.4 mg/dL (ref 8.9–10.3)
Creatinine, Ser: 0.55 mg/dL (ref 0.44–1.00)
Glucose, Bld: 86 mg/dL (ref 65–99)
POTASSIUM: 3.9 mmol/L (ref 3.5–5.1)
Sodium: 139 mmol/L (ref 135–145)
Total Bilirubin: 0.7 mg/dL (ref 0.3–1.2)
Total Protein: 8.6 g/dL — ABNORMAL HIGH (ref 6.5–8.1)

## 2016-08-20 LAB — PHOSPHORUS: PHOSPHORUS: 4 mg/dL (ref 2.5–4.6)

## 2016-08-20 LAB — APTT: APTT: 32 s (ref 24–36)

## 2016-08-20 LAB — PROTIME-INR
INR: 0.97
Prothrombin Time: 12.9 seconds (ref 11.4–15.2)

## 2016-08-20 LAB — MAGNESIUM: MAGNESIUM: 2.1 mg/dL (ref 1.7–2.4)

## 2016-08-20 MED ORDER — MORPHINE SULFATE (PF) 4 MG/ML IV SOLN: 4.0000 mg | INTRAVENOUS | Status: DC | PRN

## 2016-08-20 MED ORDER — PANTOPRAZOLE SODIUM 40 MG IV SOLR
40.0000 mg | Freq: Every day | INTRAVENOUS | Status: DC
  Administered 2016-08-20: 40 mg via INTRAVENOUS
  Filled 2016-08-20: qty 40

## 2016-08-20 MED ORDER — DIPHENHYDRAMINE HCL 25 MG PO CAPS: 25.0000 mg | ORAL_CAPSULE | Freq: Four times a day (QID) | ORAL | Status: DC | PRN

## 2016-08-20 MED ORDER — ONDANSETRON 4 MG PO TBDP: 4.0000 mg | ORAL_TABLET | Freq: Four times a day (QID) | ORAL | Status: DC | PRN

## 2016-08-20 MED ORDER — HYDRALAZINE HCL 20 MG/ML IJ SOLN: 10.0000 mg | INTRAMUSCULAR | Status: DC | PRN

## 2016-08-20 MED ORDER — TECHNETIUM TC 99M MEBROFENIN IV KIT
5.0000 | PACK | Freq: Once | INTRAVENOUS | Status: AC | PRN
  Administered 2016-08-20: 5.23 via INTRAVENOUS

## 2016-08-20 MED ORDER — DIPHENHYDRAMINE HCL 50 MG/ML IJ SOLN: 25.0000 mg | Freq: Four times a day (QID) | INTRAMUSCULAR | Status: DC | PRN

## 2016-08-20 MED ORDER — SODIUM CHLORIDE 0.9 % IV SOLN
3.0000 g | Freq: Four times a day (QID) | INTRAVENOUS | Status: DC
  Administered 2016-08-20 – 2016-08-22 (×8): 3 g via INTRAVENOUS
  Filled 2016-08-20 (×14): qty 3

## 2016-08-20 MED ORDER — ONDANSETRON HCL 4 MG/2ML IJ SOLN: 4.0000 mg | Freq: Four times a day (QID) | INTRAMUSCULAR | Status: DC | PRN

## 2016-08-20 MED ORDER — KCL IN DEXTROSE-NACL 20-5-0.45 MEQ/L-%-% IV SOLN
INTRAVENOUS | Status: DC
  Administered 2016-08-20 – 2016-08-21 (×3): via INTRAVENOUS
  Filled 2016-08-20 (×5): qty 1000

## 2016-08-20 NOTE — Progress Notes (Signed)
Discussed with patient via family member acting as interpreter the plan for endoscopy and ERCP tomorrow. Patient asked to eat. She has no pain at this time. We'll start IV antibiotics and make nothing by mouth after midnight.

## 2016-08-20 NOTE — Consult Note (Signed)
Midge Minium, MD [facility]  [address] [place], [place] [address] Phone: [phone] Fax : [phone]  Consultation  Referring Provider:     Dr. Tonita Cong Primary Care Physician:  [facility] Primary Gastroenterologist:  Gentry Fitz         Reason for Consultation:     Bile duct leak  Date of Admission:  08/20/2016 Date of Consultation:  08/20/2016         HPI:   Catherine Kennedy is a 46 y.o. female Who had her gallbladder removed at the beginning of May.  The patient had a HIDA scan today which showed a fluid collection in the gallbladder fossa. The patient does not speaking mention most the history is gotten through the patient's daughter. The patient was reporting some mild epigastric discomfort but was eating.  The patient was found to have no periductal dilatation on a previous MRCP.  The patient's white blood cell count was mildly elevated.  The MRCP showed a fluid collection in the gallbladder fossa thought to be a seroma.  Only after the patient had a HIDA scan was determined that it was a bile duct leak.  Past Medical History:  Diagnosis Date  . Acute cholecystitis 08/08/2016    Past Surgical History:  Procedure Laterality Date  . CHOLECYSTECTOMY N/A 08/08/2016   Procedure: LAPAROSCOPIC CHOLECYSTECTOMY;  Surgeon: Ancil Linsey, MD;  Location: [facility] ORS;  Service: General;  Laterality: N/A;  . FINGER SURGERY      Prior to Admission medications   Medication Sig Start Date End Date Taking? Authorizing Provider  amoxicillin-clavulanate (AUGMENTIN) 875-125 MG tablet Take 1 tablet by mouth 2 (two) times daily. 08/09/16   Ricarda Frame, MD    Family History  Problem Relation Age of Onset  . Diabetes Father      Social History  Substance Use Topics  . Smoking status: Never Smoker  . Smokeless tobacco: Never Used  . Alcohol use No    Allergies as of 08/20/2016  . (No Known Allergies)    Review of Systems:    All systems reviewed  and negative except where noted in HPI.   Physical Exam:  Vital signs in last 24 hours: Temp:  [97.6 F (36.4 C)] 97.6 F (36.4 C) (05/18 1215) Pulse Rate:  [75] 75 (05/18 1215) Resp:  [18] 18 (05/18 1215) BP: (125)/(65) 125/65 (05/18 1215) SpO2:  [100 %] 100 % (05/18 1215) Weight:  [161 lb (73 kg)] 161 lb (73 kg) (05/18 1545) Last BM Date: 08/19/16 General:   Pleasant, cooperative in NAD Head:  Normocephalic and atraumatic. Eyes:   No icterus.   Conjunctiva pink. PERRLA. Ears:  Normal auditory acuity. Neck:  Supple; no masses or thyroidomegaly Lungs: Respirations even and unlabored. Lungs clear to auscultation bilaterally.   No wheezes, crackles, or rhonchi.  Heart:  Regular rate and rhythm;  Without murmur, clicks, rubs or gallops Abdomen:  Soft, nondistended, nontender. Normal bowel sounds. No appreciable masses or hepatomegaly.  No rebound or guarding.  Rectal:  Not performed. Msk:  Symmetrical without gross deformities.    Extremities:  Without edema, cyanosis or clubbing. Neurologic:  Alert and oriented x3;  grossly normal neurologically. Skin:  Intact without significant lesions or rashes. Cervical Nodes:  No significant cervical adenopathy. Psych:  Alert and cooperative. Normal affect.  LAB RESULTS:  Recent Labs  08/19/16 1450 08/20/16 1311  WBC 13.6* 9.0  HGB 13.3 14.1  HCT 38.3 39.5  PLT 481* 496*  BMET  Recent Labs  08/19/16 1450 08/20/16 1311  NA 136 139  K 4.7 3.9  CL 101 104  CO2 28 26  GLUCOSE 106* 86  BUN 12 16  CREATININE 0.50 0.55  CALCIUM 9.5 9.4   LFT  Recent Labs  08/20/16 1311  PROT 8.6*  ALBUMIN 4.0  AST 27  ALT 51  ALKPHOS 234*  BILITOT 0.7   PT/INR  Recent Labs  08/20/16 1311  LABPROT 12.9  INR 0.97    STUDIES: Nm Hepatobiliary Including Gb  Result Date: 08/20/2016 CLINICAL DATA:  Abdominal pain, nausea. Status post cholecystectomy 08-08-16. No vomiting or diarrhea.Ultrasound report from 08-19-16 showed possible  biloma (fluid collection within GB fossa).NPO this am, no narcotics EXAM: NUCLEAR MEDICINE HEPATOBILIARY IMAGING TECHNIQUE: Sequential images of the abdomen were obtained out to 60 minutes following intravenous administration of radiopharmaceutical. RADIOPHARMACEUTICALS:  5.23 mCi Tc-1110m  Choletec IV COMPARISON:  Ultrasound, 08/19/2016 FINDINGS: A small collection of radiotracer is seen during the middle to later phases of the exam, within the gallbladder fossa consistent with a bile leak. The liver shows homogeneous accumulation of radiotracer. There is prompt excretion into the intra and extrahepatic biliary tree with small bowel activity seen early during the exam confirming patency of the common bile duct. IMPRESSION: 1. Small amount of radiotracer collects in the gallbladder fossa consistent with a bile leak. Electronically Signed   By: Amie Portlandavid  Ormond M.D.   On: 08/20/2016 11:15   [place] Abdomen Limited Ruq  Result Date: 08/19/2016 CLINICAL DATA:  Initial evaluation for acute abdominal pain, recent cholecystectomy. EXAM: US ABDOMEN LIMITED - RIGHT UPPER QUADRANT COMPARISON:  Prior MRI from 08/14/2016. FINDINGS: Gallbladder: Surgically absent. 4.9 x 4.4 x 6.7 cm complex fluid collection present within the gallbladder fossa. This is similar to previous. Common bile duct: Diameter: 7.6 mm Liver: No focal lesion identified. Within normal limits in parenchymal echogenicity. IMPRESSION: 4.9 x 4.4 x 6.7 cm complex collection. Again, finding may reflect postoperative seroma or possibly biloma. This was present on recent MRI from 08/14/2016. Electronically Signed   By: Rise MuBenjamin  McClintock M.D.   On: 08/19/2016 17:26      Impression / Plan:   Catherine Kennedy is a 46 y.o. y/o female with A recent cholecystectomy with a subsequent bile leak seen on HIDA scan.  The patient will be set up for a ERCP for tomorrow.  The patient has been explained the procedure including complications including bleeding and pancreatitis  perforation and death.  The patient agrees to undergoing the procedure will have it done tomorrow.   Thank you for involving me in the care of this patient.      LOS: 0 days   Midge Miniumarren Urian Martenson, MD  08/20/2016, 5:53 PM   Note: This dictation was prepared with Dragon dictation along with smaller phrase technology. Any transcriptional errors that result from this process are unintentional.

## 2016-08-20 NOTE — Progress Notes (Signed)
Patient's admission process completed through the interpreter, Maritza.

## 2016-08-20 NOTE — H&P (Signed)
Catherine Kennedy is an 46 y.o. female.       Chief Complaint  Patient presents with  . Routine Post Op    Post Op: Laparoscopic Cholecystectomy Dr. Rosana Hoes 08/08/2016-Blood work prior to appt.    HPI: s/p lap chole, some mild pain epigastric port, taking some PO, no biliary obstruction wbc mildly elevated and improving alk phos but still elevated. MRCP reviewed there is a collection next to the GB fossa. Initially thought to be seroma .  She is taking PO, and has some intermittent pain. Overall slowly improving.      Past Medical History:  Diagnosis Date  . Acute cholecystitis 08/08/2016         Past Surgical History:  Procedure Laterality Date  . CHOLECYSTECTOMY N/A 08/08/2016   Procedure: LAPAROSCOPIC CHOLECYSTECTOMY;  Surgeon: Vickie Epley, MD;  Location: [facility] ORS;  Service: General;  Laterality: N/A;  . FINGER SURGERY           Family History  Problem Relation Age of Onset  . Diabetes Father     Social History:  reports that she has never smoked. She has never used smokeless tobacco. She reports that she does not drink alcohol or use drugs.  Allergies: No Known Allergies  Medications reviewed.    ROS Full ROS performed and is otherwise negative other than what is stated in HPI   BP 109/74   Pulse 87   Temp 97.7 F (36.5 C) (Oral)   Wt 73 kg (161 lb)   LMP 08/02/2016 (Approximate) Comment: preg test neg  BMI 32.52 kg/m   Physical Exam NAD, non toxic ABd; soft, Mild TTP epigastric area around port incision . No evidence of wound infection or peritonitis.   Lab Results Last 48 Hours        Results for orders placed or performed during the hospital encounter of 08/19/16 (from the past 48 hour(s))  CBC with Differential     Status: Abnormal   Collection Time: 08/19/16  2:50 PM  Result Value Ref Range   WBC 13.6 (H) 3.6 - 11.0 K/uL   RBC 4.35 3.80 - 5.20 MIL/uL   Hemoglobin 13.3 12.0 - 16.0 g/dL   HCT 38.3 35.0 - 47.0 %    MCV 88.0 80.0 - 100.0 fL   MCH 30.5 26.0 - 34.0 pg   MCHC 34.6 32.0 - 36.0 g/dL   RDW 14.7 (H) 11.5 - 14.5 %   Platelets 481 (H) 150 - 440 K/uL   Neutrophils Relative % 80 %   Neutro Abs 10.9 (H) 1.4 - 6.5 K/uL   Lymphocytes Relative 13 %   Lymphs Abs 1.8 1.0 - 3.6 K/uL   Monocytes Relative 5 %   Monocytes Absolute 0.6 0.2 - 0.9 K/uL   Eosinophils Relative 1 %   Eosinophils Absolute 0.2 0 - 0.7 K/uL   Basophils Relative 1 %   Basophils Absolute 0.1 0 - 0.1 K/uL  Comprehensive metabolic panel     Status: Abnormal   Collection Time: 08/19/16  2:50 PM  Result Value Ref Range   Sodium 136 135 - 145 mmol/L   Potassium 4.7 3.5 - 5.1 mmol/L   Chloride 101 101 - 111 mmol/L   CO2 28 22 - 32 mmol/L   Glucose, Bld 106 (H) 65 - 99 mg/dL   BUN 12 6 - 20 mg/dL   Creatinine, Ser 0.50 0.44 - 1.00 mg/dL   Calcium 9.5 8.9 - 10.3 mg/dL   Total Protein 8.0 6.5 -  8.1 g/dL   Albumin 3.7 3.5 - 5.0 g/dL   AST 23 15 - 41 U/L   ALT 49 14 - 54 U/L   Alkaline Phosphatase 230 (H) 38 - 126 U/L   Total Bilirubin 0.6 0.3 - 1.2 mg/dL   GFR calc non Af Amer >60 >60 mL/min   GFR calc Af Amer >60 >60 mL/min    Comment: (NOTE) The eGFR has been calculated using the CKD EPI equation. This calculation has not been validated in all clinical situations. eGFR's persistently <60 mL/min signify possible Chronic Kidney Disease.    Anion gap 7 5 - 15     Imaging Results (Last 48 hours)  No results found.    Assessment/Plan:  1. Abdominal pain, epigastric - NM HEPATOBILIARY INCLUDING GB; Future - US Abdomen Limited RUQ; Future Patient status post laparoscopic cholecystectomy by Dr. Rosana Hoes with some collection discovered an MRCP. She continues to have and should've her alkaline phosphatase and her white count is slightly elevated. Clinically she is slowly improving but not completely symptom-free. Due to persistent increase in the alkaline phosphatase and mild elevation  of the white count and was present thing to do is to rule out either an abscess or bile leak. We'll schedule her for an ultrasound and right upper quadrant as well as a HIDA scan to rule out a bile leak. She will come back to see [place] tomorrow and at that time we'll determine if she will need any further interventions. Currently she is not toxic and does not need any emergent surgical revision at this time   HIDA scan performed and showed evidence of bile leak. She will be admitted GI consult called to Dr. Allen Norris who will see patient for possible ERCP. Patient discussed with Dr. Burt Knack who will also see the patient.  Clayburn Pert, MD [facility]  [facility] [phone] Night [facility] [phone]

## 2016-08-21 ENCOUNTER — Inpatient Hospital Stay: Payer: Self-pay

## 2016-08-21 ENCOUNTER — Inpatient Hospital Stay: Payer: Self-pay | Admitting: Anesthesiology

## 2016-08-21 ENCOUNTER — Encounter
Admission: RE | Disposition: A | Discharge status: Home / Self Care | Payer: Self-pay | Source: Ambulatory Visit | Attending: General Surgery

## 2016-08-21 HISTORY — PX: ENDOSCOPIC RETROGRADE CHOLANGIOPANCREATOGRAPHY (ERCP) WITH PROPOFOL: SHX5810

## 2016-08-21 LAB — COMPREHENSIVE METABOLIC PANEL
ALBUMIN: 3.2 g/dL — AB (ref 3.5–5.0)
ALT: 48 U/L (ref 14–54)
ANION GAP: 8 (ref 5–15)
AST: 37 U/L (ref 15–41)
Alkaline Phosphatase: 188 U/L — ABNORMAL HIGH (ref 38–126)
BILIRUBIN TOTAL: 0.9 mg/dL (ref 0.3–1.2)
BUN: 9 mg/dL (ref 6–20)
CHLORIDE: 105 mmol/L (ref 101–111)
CO2: 26 mmol/L (ref 22–32)
Calcium: 8.6 mg/dL — ABNORMAL LOW (ref 8.9–10.3)
Creatinine, Ser: 0.51 mg/dL (ref 0.44–1.00)
GFR calc Af Amer: >60 mL/min (ref >60)
GFR calc non Af Amer: >60 mL/min (ref >60)
GLUCOSE: 120 mg/dL — AB (ref 65–99)
POTASSIUM: 4 mmol/L (ref 3.5–5.1)
Sodium: 139 mmol/L (ref 135–145)
TOTAL PROTEIN: 6.8 g/dL (ref 6.5–8.1)

## 2016-08-21 LAB — CBC WITH DIFFERENTIAL/PLATELET
BASOS ABS: 0.1 10*3/uL (ref 0–0.1)
BASOS PCT: 1 %
EOS ABS: 0.1 10*3/uL (ref 0–0.7)
EOS PCT: 2 %
HEMATOCRIT: 35.2 % (ref 35.0–47.0)
Hemoglobin: 12.6 g/dL (ref 12.0–16.0)
Lymphocytes Relative: 19 %
Lymphs Abs: 1.5 10*3/uL (ref 1.0–3.6)
MCH: 31.2 pg (ref 26.0–34.0)
MCHC: 35.7 g/dL (ref 32.0–36.0)
MCV: 87.6 fL (ref 80.0–100.0)
MONOS PCT: 7 %
Monocytes Absolute: 0.6 10*3/uL (ref 0.2–0.9)
Neutro Abs: 5.5 10*3/uL (ref 1.4–6.5)
Neutrophils Relative %: 71 %
PLATELETS: 482 10*3/uL — AB (ref 150–440)
RBC: 4.02 MIL/uL (ref 3.80–5.20)
RDW: 14.8 % — AB (ref 11.5–14.5)
WBC: 7.8 10*3/uL (ref 3.6–11.0)

## 2016-08-21 SURGERY — ENDOSCOPIC RETROGRADE CHOLANGIOPANCREATOGRAPHY (ERCP) WITH PROPOFOL
Anesthesia: General

## 2016-08-21 MED ORDER — SUGAMMADEX SODIUM 200 MG/2ML IV SOLN
INTRAVENOUS | Status: DC | PRN
  Administered 2016-08-21: 150 mg via INTRAVENOUS

## 2016-08-21 MED ORDER — LIDOCAINE HCL (CARDIAC) 20 MG/ML IV SOLN
INTRAVENOUS | Status: DC | PRN
  Administered 2016-08-21: 50 mg via INTRAVENOUS

## 2016-08-21 MED ORDER — MIDAZOLAM HCL 2 MG/2ML IJ SOLN
INTRAMUSCULAR | Status: DC | PRN
  Administered 2016-08-21: 2 mg via INTRAVENOUS

## 2016-08-21 MED ORDER — ROCURONIUM BROMIDE 50 MG/5ML IV SOLN
INTRAVENOUS | Status: AC
  Filled 2016-08-21: qty 1

## 2016-08-21 MED ORDER — ONDANSETRON HCL 4 MG PO TABS: 4.0000 mg | ORAL_TABLET | Freq: Four times a day (QID) | ORAL | Status: DC | PRN

## 2016-08-21 MED ORDER — PROPOFOL 10 MG/ML IV BOLUS
INTRAVENOUS | Status: DC | PRN
  Administered 2016-08-21: 150 mg via INTRAVENOUS

## 2016-08-21 MED ORDER — ROCURONIUM BROMIDE 100 MG/10ML IV SOLN
INTRAVENOUS | Status: DC | PRN
  Administered 2016-08-21: 25 mg via INTRAVENOUS

## 2016-08-21 MED ORDER — ONDANSETRON HCL 4 MG/2ML IJ SOLN: 4.0000 mg | Freq: Four times a day (QID) | INTRAMUSCULAR | Status: DC | PRN

## 2016-08-21 MED ORDER — MIDAZOLAM HCL 2 MG/2ML IJ SOLN
INTRAMUSCULAR | Status: AC
  Filled 2016-08-21: qty 2

## 2016-08-21 MED ORDER — HYDRALAZINE HCL 20 MG/ML IJ SOLN: 10.0000 mg | INTRAMUSCULAR | Status: DC | PRN

## 2016-08-21 MED ORDER — SUCCINYLCHOLINE CHLORIDE 20 MG/ML IJ SOLN
INTRAMUSCULAR | Status: DC | PRN
  Administered 2016-08-21: 100 mg via INTRAVENOUS

## 2016-08-21 MED ORDER — SUGAMMADEX SODIUM 200 MG/2ML IV SOLN
INTRAVENOUS | Status: AC
  Filled 2016-08-21: qty 2

## 2016-08-21 MED ORDER — GLYCOPYRROLATE 0.2 MG/ML IJ SOLN
INTRAMUSCULAR | Status: AC
  Filled 2016-08-21: qty 1

## 2016-08-21 MED ORDER — PROPOFOL 10 MG/ML IV BOLUS
INTRAVENOUS | Status: AC
  Filled 2016-08-21: qty 20

## 2016-08-21 MED ORDER — PANTOPRAZOLE SODIUM 40 MG IV SOLR
40.0000 mg | Freq: Every day | INTRAVENOUS | Status: DC
  Administered 2016-08-21: 40 mg via INTRAVENOUS
  Filled 2016-08-21: qty 40

## 2016-08-21 MED ORDER — FENTANYL CITRATE (PF) 100 MCG/2ML IJ SOLN: 25.0000 ug | INTRAMUSCULAR | Status: DC | PRN

## 2016-08-21 MED ORDER — LIDOCAINE HCL 2 % IJ SOLN
INTRAMUSCULAR | Status: AC
  Filled 2016-08-21: qty 10

## 2016-08-21 MED ORDER — DEXAMETHASONE SODIUM PHOSPHATE 10 MG/ML IJ SOLN
INTRAMUSCULAR | Status: AC
  Filled 2016-08-21: qty 1

## 2016-08-21 MED ORDER — ONDANSETRON HCL 4 MG/2ML IJ SOLN: 4.0000 mg | Freq: Once | INTRAMUSCULAR | Status: DC | PRN

## 2016-08-21 MED ORDER — INDOMETHACIN 50 MG RE SUPP
100.0000 mg | Freq: Once | RECTAL | Status: AC
  Administered 2016-08-21: 100 mg via RECTAL
  Filled 2016-08-21: qty 2

## 2016-08-21 MED ORDER — ONDANSETRON HCL 4 MG/2ML IJ SOLN
INTRAMUSCULAR | Status: DC | PRN
  Administered 2016-08-21: 4 mg via INTRAVENOUS

## 2016-08-21 MED ORDER — MORPHINE SULFATE (PF) 4 MG/ML IV SOLN: 4.0000 mg | INTRAVENOUS | Status: DC | PRN

## 2016-08-21 MED ORDER — ONDANSETRON HCL 4 MG/2ML IJ SOLN
INTRAMUSCULAR | Status: AC
Start: 2016-08-21 — End: 2016-08-21
  Filled 2016-08-21: qty 2

## 2016-08-21 MED ORDER — GLYCOPYRROLATE 0.2 MG/ML IJ SOLN
INTRAMUSCULAR | Status: DC | PRN
  Administered 2016-08-21: 0.2 mg via INTRAVENOUS

## 2016-08-21 MED ORDER — SUCCINYLCHOLINE CHLORIDE 20 MG/ML IJ SOLN
INTRAMUSCULAR | Status: AC
  Filled 2016-08-21: qty 1

## 2016-08-21 MED ORDER — SODIUM CHLORIDE 0.9 % IV SOLN
INTRAVENOUS | Status: DC
  Administered 2016-08-21: 10:00:00 via INTRAVENOUS

## 2016-08-21 MED ORDER — DEXAMETHASONE SODIUM PHOSPHATE 10 MG/ML IJ SOLN
INTRAMUSCULAR | Status: DC | PRN
  Administered 2016-08-21: 5 mg via INTRAVENOUS

## 2016-08-21 NOTE — Anesthesia Post-op Follow-up Note (Cosign Needed)
Anesthesia QCDR form completed.        

## 2016-08-21 NOTE — Op Note (Addendum)
THIS EXAM WAS SENT IN ERROR

## 2016-08-21 NOTE — Op Note (Addendum)
[facility] Gastroenterology Patient Name: Catherine Kennedy Procedure Date: 08/21/2016 10:45 AM MRN: [id number] Account #: [id number] Date of Birth: [birth year] Admit Type: Inpatient Age: 46 Room: [facility] END[facility] ROOM 4 Gender: Female Note Status: Finalized Procedure:            ERCP Indications:          Suspected bile leak Providers:            Midge Miniumarren Maudell Stanbrough MD, MD Referring MD:         Leafy Roiego F. Pabon (Referring MD), No Local Md, MD                        (Referring MD) Medicines:            Propofol per Anesthesia Complications:        No immediate complications. Procedure:            Pre-Anesthesia Assessment:                       - Prior to the procedure, a History and Physical was                        performed, and patient medications and allergies were                        reviewed. The patient's tolerance of previous                        anesthesia was also reviewed. The risks and benefits of                        the procedure and the sedation options and risks were                        discussed with the patient. All questions were                        answered, and informed consent was obtained. Prior                        Anticoagulants: The patient has taken no previous                        anticoagulant or antiplatelet agents. ASA Grade                        Assessment: II - A patient with mild systemic disease.                        After reviewing the risks and benefits, the patient was                        deemed in satisfactory condition to undergo the                        procedure.                       - Prior to the procedure, a History and Physical was  performed, and patient medications and allergies were                        reviewed. The patient's tolerance of previous                        anesthesia was also reviewed. The risks and benefits of                        the procedure and the sedation  options and risks were                        discussed with the patient. All questions were                        answered, and informed consent was obtained. Prior                        Anticoagulants: The patient has taken no previous                        anticoagulant or antiplatelet agents. ASA Grade                        Assessment: II - A patient with mild systemic disease.                        After reviewing the risks and benefits, the patient was                        deemed in satisfactory condition to undergo the                        procedure.                       After obtaining informed consent, the scope was passed                        under direct vision. Throughout the procedure, the                        patient's blood pressure, pulse, and oxygen saturations                        were monitored continuously. The Endoscope was                        introduced through the mouth, and used to inject                        contrast into and used to inject contrast into the bile                        duct. The ERCP was accomplished without difficulty. The                        patient tolerated the procedure well. Findings:      A scout film of the abdomen was obtained. Surgical clips were seen in  the area of the cystic duct. The major papilla was normal. The bile duct       was deeply cannulated with the short-nosed traction sphincterotome.       Contrast was injected. I personally interpreted the bile duct images.       There was brisk flow of contrast through the ducts. Image quality was       excellent. Contrast extended to the entire biliary tree. The in the       biliary system was normal. A wire was passed into the biliary tree.       Biliary sphincterotomy was made with a traction (standard)       sphincterotome using ERBE electrocautery. There was no       post-sphincterotomy bleeding. One 10 Fr by 5 cm plastic stent was placed       5 cm  into the common bile duct. Bile flowed through the stent. The stent       was in good position. Impression:           - The major papilla appeared normal.                       - A biliary sphincterotomy was performed.                       - One plastic stent was placed into the common bile                        duct. Recommendation:       - Watch for pancreatitis, bleeding, perforation, and                        cholangitis.                       - Return to this GI lab for stent removal at ERCP in 2                        months. Procedure Code(s):    --- Professional ---                       [phone], Endoscopic retrograde cholangiopancreatography                        (ERCP); with placement of endoscopic stent into biliary                        or pancreatic duct, including pre- and post-dilation                        and guide wire passage, when performed, including                        sphincterotomy, when performed, each stent                       [id number], Endoscopic catheterization of the biliary ductal                        system, radiological supervision and interpretation Diagnosis Code(s):    --- Professional ---  K91.89, Other postprocedural complications and                        disorders of digestive system CPT copyright 2016 American Medical Association. All rights reserved. The codes documented in this report are preliminary and upon coder review may  be revised to meet current compliance requirements. Midge Minium MD, MD 08/21/2016 11:56:34 AM This report has been signed electronically. Number of Addenda: 0 Note Initiated On: 08/21/2016 10:45 AM      [facility]

## 2016-08-21 NOTE — Anesthesia Preprocedure Evaluation (Signed)
Anesthesia Evaluation  Patient identified by MRN, date of birth, ID band Patient awake    Reviewed: Allergy & Precautions, NPO status , Patient's Chart, lab work & pertinent test results  Airway Mallampati: III  TM Distance: >3 FB     Dental  (+) Chipped   Pulmonary neg pulmonary ROS,    Pulmonary exam normal        Cardiovascular negative cardio ROS Normal cardiovascular exam     Neuro/Psych negative neurological ROS  negative psych ROS   GI/Hepatic Neg liver ROS, Acute gallbladder   Endo/Other  negative endocrine ROS  Renal/GU negative Renal ROS  negative genitourinary   Musculoskeletal negative musculoskeletal ROS (+)   Abdominal Normal abdominal exam  (+) - obese,   Peds negative pediatric ROS (+)  Hematology negative hematology ROS (+)   Anesthesia Other Findings   Reproductive/Obstetrics                            Anesthesia Physical Anesthesia Plan  ASA: II and emergent  Anesthesia Plan: General   Post-op Pain Management:    Induction: Intravenous and Rapid sequence  Airway Management Planned: Oral ETT  Additional Equipment:   Intra-op Plan:   Post-operative Plan: Extubation in OR  Informed Consent: I have reviewed the patients History and Physical, chart, labs and discussed the procedure including the risks, benefits and alternatives for the proposed anesthesia with the patient or authorized representative who has indicated his/her understanding and acceptance.   Dental advisory given  Plan Discussed with: CRNA and Surgeon  Anesthesia Plan Comments:         Anesthesia Quick Evaluation  

## 2016-08-21 NOTE — Anesthesia Postprocedure Evaluation (Signed)
Anesthesia Post Note  Patient: Catherine Kennedy  Procedure(s) Performed: Procedure(s) (LRB): ENDOSCOPIC RETROGRADE CHOLANGIOPANCREATOGRAPHY (ERCP) WITH PROPOFOL (N/A)  Patient location during evaluation: PACU Anesthesia Type: General Level of consciousness: awake and alert and oriented Pain management: pain level controlled Vital Signs Assessment: post-procedure vital signs reviewed and stable Respiratory status: spontaneous breathing Cardiovascular status: blood pressure returned to baseline Anesthetic complications: no     Last Vitals:  Vitals:   08/21/16 1159 08/21/16 1214  BP: 105/65 106/63  Pulse: 68 66  Resp: 14 17  Temp: 36.3 C     Last Pain:  Vitals:   08/21/16 1144  TempSrc:   PainSc: Asleep                 Jade Burkard

## 2016-08-21 NOTE — Progress Notes (Signed)
Patient seen in the endoscopy suite with Dr. Servando SnareWohl. Patient was under anesthesia.  Vital signs are reviewed.  Physical exam not done at this time.  Witnessed ERCP stent and papillotomy. Very small blush of contrast leaking from a duct of [place] very lateral in the right lateral ductal system.  We will start clear liquids and recheck labs tomorrow probably discharge tomorrow

## 2016-08-21 NOTE — Anesthesia Procedure Notes (Signed)
Procedure Name: Intubation Performed by: Gricelda Foland Pre-anesthesia Checklist: Patient identified, Patient being monitored, Timeout performed, Emergency Drugs available and Suction available Patient Re-evaluated:Patient Re-evaluated prior to inductionOxygen Delivery Method: Circle system utilized Preoxygenation: Pre-oxygenation with 100% oxygen Intubation Type: IV induction, Rapid sequence and Cricoid Pressure applied Laryngoscope Size: Miller and 2 Grade View: Grade I Tube type: Oral Tube size: 7.0 mm Number of attempts: 1 Airway Equipment and Method: Stylet Placement Confirmation: ETT inserted through vocal cords under direct vision,  positive ETCO2 and breath sounds checked- equal and bilateral Secured at: 21 cm Tube secured with: Tape Dental Injury: Teeth and Oropharynx as per pre-operative assessment        

## 2016-08-21 NOTE — Transfer of Care (Signed)
Immediate Anesthesia Transfer of Care Note  Patient: Catherine Kennedy  Procedure(s) Performed: Procedure(s): ENDOSCOPIC RETROGRADE CHOLANGIOPANCREATOGRAPHY (ERCP) WITH PROPOFOL (N/A)  Patient Location: PACU  Anesthesia Type:General  Level of Consciousness: awake  Airway & Oxygen Therapy: Patient Spontanous Breathing and Patient connected to face mask oxygen  Post-op Assessment: Report given to RN and Post -op Vital signs reviewed and stable  Post vital signs: Reviewed  Last Vitals:  Vitals:   08/21/16 1013 08/21/16 1129  BP: 124/71 (!) 94/56  Pulse: 75 85  Resp: 18 12  Temp: 36.5 C 36.3 C    Last Pain:  Vitals:   08/21/16 1013  TempSrc: Tympanic  PainSc: 0-No pain         Complications: No apparent anesthesia complications

## 2016-08-22 LAB — CBC WITH DIFFERENTIAL/PLATELET
Basophils Absolute: 0.1 10*3/uL (ref 0–0.1)
Basophils Relative: 0 %
EOS ABS: 0 10*3/uL (ref 0–0.7)
Eosinophils Relative: 0 %
HCT: 34.9 % — ABNORMAL LOW (ref 35.0–47.0)
Hemoglobin: 12.5 g/dL (ref 12.0–16.0)
LYMPHS ABS: 1.9 10*3/uL (ref 1.0–3.6)
LYMPHS PCT: 14 %
MCH: 31.4 pg (ref 26.0–34.0)
MCHC: 35.7 g/dL (ref 32.0–36.0)
MCV: 87.9 fL (ref 80.0–100.0)
Monocytes Absolute: 0.7 10*3/uL (ref 0.2–0.9)
Monocytes Relative: 6 %
Neutro Abs: 10.5 10*3/uL — ABNORMAL HIGH (ref 1.4–6.5)
Neutrophils Relative %: 80 %
Platelets: 523 10*3/uL — ABNORMAL HIGH (ref 150–440)
RBC: 3.97 MIL/uL (ref 3.80–5.20)
RDW: 14.3 % (ref 11.5–14.5)
WBC: 13.1 10*3/uL — AB (ref 3.6–11.0)

## 2016-08-22 LAB — COMPREHENSIVE METABOLIC PANEL
ALK PHOS: 207 U/L — AB (ref 38–126)
ALT: 51 U/L (ref 14–54)
AST: 30 U/L (ref 15–41)
Albumin: 3.4 g/dL — ABNORMAL LOW (ref 3.5–5.0)
Anion gap: 8 (ref 5–15)
BUN: 7 mg/dL (ref 6–20)
CALCIUM: 9 mg/dL (ref 8.9–10.3)
CO2: 26 mmol/L (ref 22–32)
CREATININE: 0.53 mg/dL (ref 0.44–1.00)
Chloride: 104 mmol/L (ref 101–111)
GFR calc non Af Amer: >60 mL/min (ref >60)
GLUCOSE: 112 mg/dL — AB (ref 65–99)
Potassium: 3.5 mmol/L (ref 3.5–5.1)
SODIUM: 138 mmol/L (ref 135–145)
Total Bilirubin: 1 mg/dL (ref 0.3–1.2)
Total Protein: 7.2 g/dL (ref 6.5–8.1)

## 2016-08-22 LAB — LIPASE, BLOOD: Lipase: 163 U/L — ABNORMAL HIGH (ref 11–51)

## 2016-08-22 MED ORDER — HYDROCODONE-ACETAMINOPHEN 5-300 MG PO TABS: 1.0000 | ORAL_TABLET | ORAL | 0 refills | Status: DC | PRN

## 2016-08-22 NOTE — Discharge Summary (Signed)
Physician Discharge Summary  Patient ID: Catherine Kennedy MRN: [id number] DOB/AGE: 08/19/1970 46 y.o.  Admit date: 08/20/2016 Discharge date: 08/22/2016   Discharge Diagnoses:  Active Problems:   Bile leak, postoperative   Procedures:ERCP/stent  Hospital Course: This patient status post laparoscopic cholecystectomy who presented to the office and was admitted to the hospital with a diagnosis of a bile leak. An ERCP was diagnostic and therapeutic in that a stent was placed for a small right lateral leak from a duct of [place]. Patient made a non-complicated postprocedural recovery and is tolerating a regular diet she has no pain and wants to go home. Will be discharged stable condition with oral analgesics to follow up in our office with Dr. Earlene Plateravis on Wednesday  Consults: GI  Disposition: 01-Home or Self Care   Allergies as of 08/22/2016   No Known Allergies     Medication List    TAKE these medications   amoxicillin-clavulanate 875-125 MG tablet Commonly known as:  AUGMENTIN Take 1 tablet by mouth 2 (two) times daily.   Hydrocodone-Acetaminophen 5-300 MG Tabs Commonly known as:  VICODIN Take 1 tablet by mouth every 4 (four) hours as needed.      Follow-up Information    Ancil Linseyavis, Jason Evan, MD Follow up in 3 day(s).   Specialty:  General Surgery Contact information: [address] [place] [address] [phone]           [name], MD, FACS

## 2016-08-22 NOTE — Discharge Instructions (Signed)
Resume home medications including Augmentin and oral analgesics May shower Follow-up with Dr. Earlene Plateravis on Wednesday Resume regular diet Return to the OR should you experience fevers chills nausea or vomiting

## 2016-08-22 NOTE — Progress Notes (Signed)
CC: Bile leak Subjective: Patient states she feels better today. No nausea vomiting  Objective: Vital signs in last 24 hours: Temp:  [97.3 F (36.3 C)-98.4 F (36.9 C)] 98.4 F (36.9 C) (05/20 0504) Pulse Rate:  [62-85] 62 (05/20 0504) Resp:  [12-18] 16 (05/20 0504) BP: (94-124)/(52-80) 101/52 (05/20 0504) SpO2:  [95 %-100 %] 100 % (05/20 0504) Last BM Date: 08/21/16  Intake/Output from previous day: 05/19 0701 - 05/20 0700 In: 1670 [P.O.:750; I.V.:820; IV Piggyback:100] Out: 600 [Urine:600] Intake/Output this shift: No intake/output data recorded.  Physical exam:  No icterus no jaundice abdomen is soft nontender  Lab Results: CBC   Recent Labs  08/21/16 0454 08/22/16 0506  WBC 7.8 13.1*  HGB 12.6 12.5  HCT 35.2 34.9*  PLT 482* 523*   BMET  Recent Labs  08/21/16 0454 08/22/16 0506  NA 139 138  K 4.0 3.5  CL 105 104  CO2 26 26  GLUCOSE 120* 112*  BUN 9 7  CREATININE 0.51 0.53  CALCIUM 8.6* 9.0   PT/INR  Recent Labs  08/20/16 1311  LABPROT 12.9  INR 0.97   ABG No results for input(s): PHART, HCO3 in the last 72 hours.  Invalid input(s): PCO2, PO2  Studies/Results: Nm Hepatobiliary Including Gb  Result Date: 08/20/2016 CLINICAL DATA:  Abdominal pain, nausea. Status post cholecystectomy 08-08-16. No vomiting or diarrhea.Ultrasound report from 08-19-16 showed possible biloma (fluid collection within GB fossa).NPO this am, no narcotics EXAM: NUCLEAR MEDICINE HEPATOBILIARY IMAGING TECHNIQUE: Sequential images of the abdomen were obtained out to 60 minutes following intravenous administration of radiopharmaceutical. RADIOPHARMACEUTICALS:  5.23 mCi Tc-2978m  Choletec IV COMPARISON:  Ultrasound, 08/19/2016 FINDINGS: A small collection of radiotracer is seen during the middle to later phases of the exam, within the gallbladder fossa consistent with a bile leak. The liver shows homogeneous accumulation of radiotracer. There is prompt excretion into the intra and  extrahepatic biliary tree with small bowel activity seen early during the exam confirming patency of the common bile duct. IMPRESSION: 1. Small amount of radiotracer collects in the gallbladder fossa consistent with a bile leak. Electronically Signed   By: Amie Portlandavid  Ormond M.D.   On: 08/20/2016 11:15   Dg C-arm 1-60 Min-no Report  Result Date: 08/21/2016 Fluoroscopy was utilized by the requesting physician.  No radiographic interpretation.    Anti-infectives: Anti-infectives    Start     Dose/Rate Route Frequency Ordered Stop   08/20/16 1700  Ampicillin-Sulbactam (UNASYN) 3 g in sodium chloride 0.9 % 100 mL IVPB     3 g 200 mL/hr over 30 Minutes Intravenous Every 6 hours 08/20/16 1658        Assessment/Plan:  Slight increase in lipase but not very significantly elevated. LFTs remained normal. Will advance diet and probably discharge later today  Lattie Hawichard E Cooper, MD, FACS  08/22/2016

## 2016-08-22 NOTE — Progress Notes (Signed)
Discharge instructions reviewed with the patient and her family with an interpreter.  rx for vicodin given to the patient

## 2016-08-25 ENCOUNTER — Ambulatory Visit (INDEPENDENT_AMBULATORY_CARE_PROVIDER_SITE_OTHER): Payer: Self-pay | Admitting: Surgery

## 2016-08-25 ENCOUNTER — Other Ambulatory Visit: Payer: Self-pay

## 2016-08-25 ENCOUNTER — Encounter: Payer: Self-pay | Admitting: Surgery

## 2016-08-25 VITALS — BP 120/82 | HR 80 | Temp 97.8°F | Wt 160.0 lb

## 2016-08-25 DIAGNOSIS — K810 Acute cholecystitis: Secondary | ICD-10-CM

## 2016-08-25 DIAGNOSIS — IMO0002 Reserved for concepts with insufficient information to code with codable children: Secondary | ICD-10-CM

## 2016-08-25 DIAGNOSIS — K9189 Other postprocedural complications and disorders of digestive system: Principal | ICD-10-CM

## 2016-08-25 DIAGNOSIS — Z4889 Encounter for other specified surgical aftercare: Secondary | ICD-10-CM

## 2016-08-25 DIAGNOSIS — K838 Other specified diseases of biliary tract: Secondary | ICD-10-CM

## 2016-08-25 NOTE — Progress Notes (Signed)
Surgical Clinic Progress/Follow-up Note   HPI:  46 y.o. Female presents to clinic for post-op follow-up evaluation s/p laparoscopic cholecystectomy for acute cholecystitis and subsequent ERCP with common bile duct stent for duct of Luschka bile duct leak. Patient reports her pre-cholecystectomy post-prandial pain has completely resolved with only minimal Right of epigastric peri-incisional pain that has continued to improve since surgery and has not recently required any narcotic pain medication and Tylenol only twice yesterday for pain control (none today). Patient describes tolerating a regular diet with no N/V and non-loose daily BM's WNL, denies fever/chills, CP, or SOB.  Review of Systems:  Constitutional: denies any other weight loss, fever, chills, or sweats  Eyes: denies any other vision changes, history of eye injury  ENT: denies sore throat, hearing problems  Respiratory: denies shortness of breath, wheezing  Cardiovascular: denies chest pain, palpitations  Gastrointestinal: abdominal pain, N/V, and bowel function as per HPI Musculoskeletal: denies any other joint pains or cramps  Skin: Denies any other rashes or skin discolorations  Neurological: denies any other headache, dizziness, weakness  Psychiatric: denies any other depression, anxiety  All other review of systems: otherwise negative   Vital Signs:  BP 120/82   Pulse 80   Temp 97.8 F (36.6 C) (Oral)   Wt 160 lb (72.6 kg)   LMP 08/02/2016 (Approximate) Comment: preg test neg  BMI 31.25 kg/m    Physical Exam:  Constitutional:  -- Obese body habitus  -- Awake, alert, and oriented x3  Eyes:  -- Pupils equally round and reactive to light  -- No scleral icterus  Ear, nose, throat:  -- No jugular venous distension  -- No nasal drainage, bleeding Pulmonary:  -- No crackles  -- No dullness to percussion  Cardiovascular:  -- S1, S2 present  -- No pericardial rubs  Gastrointestinal:  -- Incisions are all  well-approximated without any surrounding erythema or drainage -- Soft, minimal tenderness to palpation at Right of epigastric incision, otherwise completely NT, nondistended, no guarding/rebound  -- No abdominal masses appreciated, pulsatile or otherwise  Musculoskeletal / Integumentary:  -- Wounds or skin discoloration: None except post-surgical abdominal wounds as described above (GI)  -- Extremities: B/L UE and LE FROM, hands and feet warm, no edema  Neurologic:  -- Motor function: intact and symmetric  -- Sensation: intact and symmetric   Laboratory studies:  CMP Latest Ref Rng & Units 08/22/2016 08/21/2016 08/20/2016  Glucose 65 - 99 mg/dL 161(W112(H) 960(A120(H) 86  BUN 6 - 20 mg/dL 7 9 16   Creatinine 0.44 - 1.00 mg/dL 5.400.53 9.810.51 1.910.55  Sodium 135 - 145 mmol/L 138 139 139  Potassium 3.5 - 5.1 mmol/L 3.5 4.0 3.9  Chloride 101 - 111 mmol/L 104 105 104  CO2 22 - 32 mmol/L 26 26 26   Calcium 8.9 - 10.3 mg/dL 9.0 4.7(W8.6(L) 9.4  Total Protein 6.5 - 8.1 g/dL 7.2 6.8 2.9(F8.6(H)  Total Bilirubin 0.3 - 1.2 mg/dL 1.0 0.9 0.7  Alkaline Phos 38 - 126 U/L 207(H) 188(H) 234(H)  AST 15 - 41 U/L 30 37 27  ALT 14 - 54 U/L 51 48 51   Assessment:  46 y.o. yo Female with a problem list including...  Patient Active Problem List   Diagnosis Date Noted  . Bile leak, postoperative 08/20/2016  . Acute cholecystitis 08/07/2016    presents to clinic for post-op follow-up evaluation, doing well s/p laparoscopic cholecystectomy for acute cholecystitis and subsequent ERCP with common bile duct stent for duct of Luschka bile duct  leak, complicated by comorbidities including obesity (BMI >31).  Plan:   - pain control prn  - okay to shower and/or bath/swim prn, okay to resume ADL's  - apply sun block to incisions with sun exposure to reduce hyperpigmentation of scars  - follow up with Dr. Servando Snare (gastroenterologist) as scheduled in 2 months for ERCP with stent retrieval  - return to clinic as needed, instructed to call office  if any questions or concerns  All of the above recommendations were discussed via translator with the patient and patient's english- and spanish-speaking daughter, and all of patient's and family's questions were answered to their expressed satisfaction.  -- Scherrie Gerlach Earlene Plater, MD, RPVI [place]: [facility] General Surgery - Partnering for exceptional care. Office: [phone]

## 2016-08-25 NOTE — Patient Instructions (Addendum)
Por favor llamenos si tiene alguna pregunta.  Yo voy a llamar a la oficina del Dr. Servando SnareWohl para ver cuando le pueden remover al stent abdominal.

## 2016-08-26 ENCOUNTER — Encounter: Payer: Self-pay | Admitting: Gastroenterology

## 2016-08-26 ENCOUNTER — Telehealth: Payer: Self-pay

## 2016-08-26 NOTE — Telephone Encounter (Signed)
Called patient to let her know that her stents will be removed on 10/19/2016. However, patient was told that she needed to call [phone] and speak with Trish on 10/18/2016. Patient understood and had no further questions. All of this was scheduled by Ginger F. (CMA).

## 2016-08-30 ENCOUNTER — Inpatient Hospital Stay
Admission: EM | Admit: 2016-08-30 | Discharge: 2016-09-02 | DRG: 862 | Disposition: A | Discharge status: Home / Self Care | Payer: Self-pay | Attending: Surgery | Admitting: Surgery

## 2016-08-30 ENCOUNTER — Encounter: Payer: Self-pay | Admitting: Emergency Medicine

## 2016-08-30 ENCOUNTER — Emergency Department: Payer: Self-pay

## 2016-08-30 DIAGNOSIS — K651 Peritoneal abscess: Secondary | ICD-10-CM | POA: Diagnosis present

## 2016-08-30 DIAGNOSIS — K831 Obstruction of bile duct: Secondary | ICD-10-CM

## 2016-08-30 DIAGNOSIS — T814XXA Infection following a procedure, initial encounter: Principal | ICD-10-CM | POA: Diagnosis present

## 2016-08-30 DIAGNOSIS — B999 Unspecified infectious disease: Secondary | ICD-10-CM

## 2016-08-30 DIAGNOSIS — T8189XA Other complications of procedures, not elsewhere classified, initial encounter: Secondary | ICD-10-CM

## 2016-08-30 DIAGNOSIS — Y658 Other specified misadventures during surgical and medical care: Secondary | ICD-10-CM | POA: Diagnosis present

## 2016-08-30 DIAGNOSIS — K668 Other specified disorders of peritoneum: Secondary | ICD-10-CM | POA: Diagnosis present

## 2016-08-30 LAB — URINALYSIS, COMPLETE (UACMP) WITH MICROSCOPIC
BACTERIA UA: NONE SEEN
BILIRUBIN URINE: NEGATIVE
Glucose, UA: NEGATIVE mg/dL
KETONES UR: NEGATIVE mg/dL
Nitrite: NEGATIVE
PROTEIN: 100 mg/dL — AB
SPECIFIC GRAVITY, URINE: 1.026 (ref 1.005–1.030)
pH: 5 (ref 5.0–8.0)

## 2016-08-30 LAB — COMPREHENSIVE METABOLIC PANEL
ALBUMIN: 4.1 g/dL (ref 3.5–5.0)
ALT: 87 U/L — ABNORMAL HIGH (ref 14–54)
AST: 67 U/L — AB (ref 15–41)
Alkaline Phosphatase: 224 U/L — ABNORMAL HIGH (ref 38–126)
Anion gap: 8 (ref 5–15)
BUN: 8 mg/dL (ref 6–20)
CHLORIDE: 102 mmol/L (ref 101–111)
CO2: 25 mmol/L (ref 22–32)
Calcium: 9.1 mg/dL (ref 8.9–10.3)
Creatinine, Ser: 0.51 mg/dL (ref 0.44–1.00)
GFR calc Af Amer: >60 mL/min (ref >60)
GLUCOSE: 112 mg/dL — AB (ref 65–99)
POTASSIUM: 3.3 mmol/L — AB (ref 3.5–5.1)
Sodium: 135 mmol/L (ref 135–145)
Total Bilirubin: 2.4 mg/dL — ABNORMAL HIGH (ref 0.3–1.2)
Total Protein: 8.7 g/dL — ABNORMAL HIGH (ref 6.5–8.1)

## 2016-08-30 LAB — CBC
HEMATOCRIT: 38.4 % (ref 35.0–47.0)
Hemoglobin: 13.3 g/dL (ref 12.0–16.0)
MCH: 30.2 pg (ref 26.0–34.0)
MCHC: 34.5 g/dL (ref 32.0–36.0)
MCV: 87.6 fL (ref 80.0–100.0)
Platelets: 377 10*3/uL (ref 150–440)
RBC: 4.38 MIL/uL (ref 3.80–5.20)
RDW: 14.4 % (ref 11.5–14.5)
WBC: 19.6 10*3/uL — AB (ref 3.6–11.0)

## 2016-08-30 LAB — LIPASE, BLOOD: LIPASE: 23 U/L (ref 11–51)

## 2016-08-30 LAB — BILIRUBIN, DIRECT: BILIRUBIN DIRECT: 0.8 mg/dL — AB (ref 0.1–0.5)

## 2016-08-30 LAB — PREGNANCY, URINE: PREG TEST UR: NEGATIVE

## 2016-08-30 MED ORDER — KETOROLAC TROMETHAMINE 30 MG/ML IJ SOLN
30.0000 mg | Freq: Four times a day (QID) | INTRAMUSCULAR | Status: DC | PRN
  Administered 2016-09-02: 30 mg via INTRAVENOUS
  Filled 2016-08-30 (×2): qty 1

## 2016-08-30 MED ORDER — SODIUM CHLORIDE 0.9 % IV SOLN
3.0000 g | Freq: Four times a day (QID) | INTRAVENOUS | Status: DC
  Administered 2016-08-30 – 2016-08-31 (×4): 3 g via INTRAVENOUS
  Filled 2016-08-30 (×7): qty 3

## 2016-08-30 MED ORDER — ONDANSETRON HCL 4 MG/2ML IJ SOLN
4.0000 mg | Freq: Four times a day (QID) | INTRAMUSCULAR | Status: DC | PRN
  Administered 2016-08-31 – 2016-09-01 (×3): 4 mg via INTRAVENOUS
  Filled 2016-08-30 (×3): qty 2

## 2016-08-30 MED ORDER — SODIUM CHLORIDE 0.9 % IV BOLUS (SEPSIS)
1000.0000 mL | Freq: Once | INTRAVENOUS | Status: AC
  Administered 2016-08-30: 1000 mL via INTRAVENOUS

## 2016-08-30 MED ORDER — IOPAMIDOL (ISOVUE-300) INJECTION 61%
100.0000 mL | Freq: Once | INTRAVENOUS | Status: AC | PRN
  Administered 2016-08-30: 100 mL via INTRAVENOUS

## 2016-08-30 MED ORDER — IOPAMIDOL (ISOVUE-300) INJECTION 61%
15.0000 mL | INTRAVENOUS | Status: AC
  Administered 2016-08-30 (×2): 15 mL via ORAL

## 2016-08-30 MED ORDER — PANTOPRAZOLE SODIUM 40 MG IV SOLR
40.0000 mg | Freq: Every day | INTRAVENOUS | Status: DC
  Administered 2016-08-30 – 2016-09-01 (×3): 40 mg via INTRAVENOUS
  Filled 2016-08-30 (×3): qty 40

## 2016-08-30 MED ORDER — HYDROCODONE-ACETAMINOPHEN 5-325 MG PO TABS
1.0000 | ORAL_TABLET | ORAL | Status: DC | PRN
  Administered 2016-08-31 (×2): 1 via ORAL
  Administered 2016-08-31 – 2016-09-01 (×3): 2 via ORAL
  Filled 2016-08-30: qty 1
  Filled 2016-08-30 (×3): qty 2

## 2016-08-30 MED ORDER — DEXTROSE IN LACTATED RINGERS 5 % IV SOLN
INTRAVENOUS | Status: DC
  Administered 2016-08-30 – 2016-08-31 (×2): via INTRAVENOUS
  Filled 2016-08-30 (×4): qty 1000

## 2016-08-30 MED ORDER — ONDANSETRON HCL 4 MG/2ML IJ SOLN
4.0000 mg | Freq: Once | INTRAMUSCULAR | Status: AC
  Administered 2016-08-30: 4 mg via INTRAVENOUS

## 2016-08-30 MED ORDER — ONDANSETRON 4 MG PO TBDP: 4.0000 mg | ORAL_TABLET | Freq: Four times a day (QID) | ORAL | Status: DC | PRN

## 2016-08-30 MED ORDER — MORPHINE SULFATE (PF) 4 MG/ML IV SOLN
INTRAVENOUS | Status: AC
  Administered 2016-08-30: 4 mg via INTRAVENOUS
  Filled 2016-08-30: qty 1

## 2016-08-30 MED ORDER — MORPHINE SULFATE (PF) 2 MG/ML IV SOLN
2.0000 mg | INTRAVENOUS | Status: DC | PRN
Start: 2016-08-30 — End: 2016-09-02

## 2016-08-30 MED ORDER — ACETAMINOPHEN 325 MG PO TABS
650.0000 mg | ORAL_TABLET | ORAL | Status: DC | PRN
  Administered 2016-08-30 – 2016-09-01 (×2): 650 mg via ORAL
  Filled 2016-08-30 (×2): qty 2

## 2016-08-30 MED ORDER — MORPHINE SULFATE (PF) 4 MG/ML IV SOLN
4.0000 mg | Freq: Once | INTRAVENOUS | Status: AC
Start: 2016-08-30 — End: 2016-08-30
  Administered 2016-08-30: 4 mg via INTRAVENOUS

## 2016-08-30 MED ORDER — PIPERACILLIN-TAZOBACTAM 3.375 G IVPB 30 MIN
3.3750 g | Freq: Once | INTRAVENOUS | Status: AC
  Administered 2016-08-30: 3.375 g via INTRAVENOUS
  Filled 2016-08-30: qty 50

## 2016-08-30 MED ORDER — ONDANSETRON HCL 4 MG/2ML IJ SOLN
INTRAMUSCULAR | Status: AC
  Administered 2016-08-30: 4 mg via INTRAVENOUS
  Filled 2016-08-30: qty 2

## 2016-08-30 NOTE — Progress Notes (Signed)
Verbal order from on call surgeon for pt to have clear liquids until midnight and then NPO at midnight. Will continue to monitor pt.   Slayton Lubitz [place]

## 2016-08-30 NOTE — ED Notes (Signed)
This RN unable to obtain blood cultures, pt stuck multiple times by ED staff. MD Lord informed, given order to start zosyn w/o blood cultures

## 2016-08-30 NOTE — ED Notes (Signed)
Patient transported to CT 

## 2016-08-30 NOTE — H&P (Signed)
Patient ID: Catherine Kennedy, female   DOB: Mar 11, 1971, 46 y.o.   MRN: [id number]  HPI Catherine Kennedy is a 46 y.o. female well known to our service status post laparoscopic cholecystectomy by Dr. Earlene Plater on 08/08/2016. She developed a bile leak that required ERCP and stent and was discharged about 6 days ago and was doing well up until couple of days ago when she started having recurrent right upper quadrant pain and fevers and chills. Patient reports that the pain is moderate to severe in nature is intermittent and is sharp in nature. The specific aggravating or alleviating factors. No evidence of cholangitis no evidence of jaundice. Some decreased appetite. Her white count today was 19,000 with a left shift and her bilirubin was slightly elevated ( normal conjugated bilirubin) and increased on the AST and A LT. CT scan personal review there is evidence of a fluid collection in the gallbladder fossa consistent with biloma. No evidence of pancreatitis or free air or any acute intra-abdominal pathology.  HPI  Past Medical History:  Diagnosis Date  . Acute cholecystitis 08/08/2016    Past Surgical History:  Procedure Laterality Date  . CHOLECYSTECTOMY N/A 08/08/2016   Procedure: LAPAROSCOPIC CHOLECYSTECTOMY;  Surgeon: Ancil Linsey, MD;  Location: [facility] ORS;  Service: General;  Laterality: N/A;  . ENDOSCOPIC RETROGRADE CHOLANGIOPANCREATOGRAPHY (ERCP) WITH PROPOFOL N/A 08/21/2016   Procedure: ENDOSCOPIC RETROGRADE CHOLANGIOPANCREATOGRAPHY (ERCP) WITH PROPOFOL;  Surgeon: Midge Minium, MD;  Location: [facility] ENDOSCOPY;  Service: Endoscopy;  Laterality: N/A;  . FINGER SURGERY      Family History  Problem Relation Age of Onset  . Diabetes Father     Social History Social History  Substance Use Topics  . Smoking status: Never Smoker  . Smokeless tobacco: Never Used  . Alcohol use No    No Known Allergies  Current Facility-Administered Medications  Medication Dose Route Frequency Provider  Last Rate Last Dose  . piperacillin-tazobactam (ZOSYN) IVPB 3.375 g  3.375 g Intravenous Once Governor Rooks, MD 100 mL/hr at 08/30/16 1413 3.375 g at 08/30/16 1413  . sodium chloride 0.9 % bolus 1,000 mL  1,000 mL Intravenous Once Pabon, Merri Ray, MD       No current outpatient prescriptions on file.     Review of Systems Full ROS  was asked and was negative except for the information on the HPI  Physical Exam Blood pressure 113/66, pulse 99, temperature 99.8 F (37.7 C), temperature source Oral, resp. rate 18, height 4' 11.06" (1.5 m), weight 73 kg (161 lb), last menstrual period 08/02/2016, SpO2 100 %. CONSTITUTIONAL: NAD EYES: Pupils are equal, round, and reactive to light, Sclera are non-icteric. EARS, NOSE, MOUTH AND THROAT: The oropharynx is clear. The oral mucosa is pink and moist. Hearing is intact to voice. LYMPH NODES:  Lymph nodes in the neck are normal. RESPIRATORY:  Lungs are clear. There is normal respiratory effort, with equal breath sounds bilaterally, and without pathologic use of accessory muscles. CARDIOVASCULAR: Heart is regular without murmurs, gallops, or rubs. GI: The abdomen is soft, mildly tender to palpation right upper quadrant. No peritonitis. Incisions are healing well without infection  GU: Rectal deferred.   MUSCULOSKELETAL: Normal muscle strength and tone. No cyanosis or edema.   SKIN: Turgor is good and there are no pathologic skin lesions or ulcers. NEUROLOGIC: Motor and sensation is grossly normal. Cranial nerves are grossly intact. PSYCH:  Oriented to person, place and time. Affect is normal.  Data Reviewed  I have personally reviewed the  patient's imaging, laboratory findings and medical records.    Assessment / Plan  46 year old female status post laparoscopic cholecystectomy complicated by small bile leak consequently underwent ERCP and stent placement. Now presents with right upper quadrant pain and a collection that is causing her symptoms. We  will plan to start IV antibiotics and ask interventional radiology to put a drain where the fluid collection is. No need for surgical intervention at this time. No evidence of cholangitis or any major biliary complication. We will admit and put in order for drain placement. D/W the pt in detail and Dr. Shaune PollackLord and they understand.   Sterling Bigiego Pabon, MD FACS General Surgeon 08/30/2016, 2:25 PM

## 2016-08-30 NOTE — ED Triage Notes (Signed)
Per spanish interpreter, pt had gallbladder sugery 5/6, then had ERCP last Saturday; pt reports fever and back pain that radiates around the right side to the abdomen. Pt reports decreased appetite.

## 2016-08-30 NOTE — ED Provider Notes (Signed)
[facility] Emergency Department Provider Note ____________________________________________   I have reviewed the triage vital signs and the triage nursing note.  HISTORY  Chief Complaint Abdominal Pain   Historian Patient, through Spanish interpreter  HPI Catherine Kennedy is a 46 y.o. female presents with right upper quadrant and right flank pain for a day or so. She is a complicated recent history of acute cholecystitis with gallbladder removal, followed by need for biliary stent which was placed about 2 weeks ago. She states that she had been doing well at home for the past couple days she's had decreased appetite and nausea. Now she is having about 24 hours of right upper quadrant pain. Denies fever. No vomiting or diarrhea. She did have moderate abdominal pain right upper quadrant 5 out of 10. Movement seems to make it worse eating seems to make it worse.    Past Medical History:  Diagnosis Date  . Acute cholecystitis 08/08/2016    Patient Active Problem List   Diagnosis Date Noted  . Biloma following surgery   . Bile leak, postoperative 08/20/2016  . Acute cholecystitis 08/07/2016    Past Surgical History:  Procedure Laterality Date  . CHOLECYSTECTOMY N/A 08/08/2016   Procedure: LAPAROSCOPIC CHOLECYSTECTOMY;  Surgeon: Ancil Linseyavis, Jason Evan, MD;  Location: [facility] ORS;  Service: General;  Laterality: N/A;  . ENDOSCOPIC RETROGRADE CHOLANGIOPANCREATOGRAPHY (ERCP) WITH PROPOFOL N/A 08/21/2016   Procedure: ENDOSCOPIC RETROGRADE CHOLANGIOPANCREATOGRAPHY (ERCP) WITH PROPOFOL;  Surgeon: Midge MiniumWohl, Darren, MD;  Location: [facility] ENDOSCOPY;  Service: Endoscopy;  Laterality: N/A;  . FINGER SURGERY      Prior to Admission medications   Not on File    No Known Allergies  Family History  Problem Relation Age of Onset  . Diabetes Father     Social History Social History  Substance Use Topics  . Smoking status: Never Smoker  . Smokeless tobacco: Never Used  .  Alcohol use No    Review of Systems  Constitutional: Negative for fever. Eyes: Negative for visual changes. ENT: Negative for sore throat. Cardiovascular: Negative for chest pain. Respiratory: Negative for shortness of breath. Gastrointestinal: Abdominal pain, right upper quadrant as per history of present illness. Genitourinary: Negative for dysuria. Musculoskeletal: Negative for back pain. Skin: Negative for rash. Neurological: Negative for headache.  ____________________________________________   PHYSICAL EXAM:  VITAL SIGNS: ED Triage Vitals  Enc Vitals Group     BP 08/30/16 1003 106/82     Pulse Rate 08/30/16 1003 (!) 113     Resp 08/30/16 1003 20     Temp 08/30/16 1003 99.8 F (37.7 C)     Temp Source 08/30/16 1003 Oral     SpO2 08/30/16 1003 99 %     Weight 08/30/16 1004 161 lb (73 kg)     Height 08/30/16 1004 4' 11.06" (1.5 m)     Head Circumference --      Peak Flow --      Pain Score 08/30/16 1011 5     Pain Loc --      Pain Edu? --      Excl. in GC? --      Constitutional: Alert and oriented. Well appearing and in no distress. HEENT   Head: Normocephalic and atraumatic.      Eyes: Conjunctivae are normal. Pupils equal and round.       Ears:         Nose: No congestion/rhinnorhea.   Mouth/Throat: Mucous membranes are moist.   Neck: No stridor. Cardiovascular/Chest: Normal  rate, regular rhythm.  No murmurs, rubs, or gallops. Respiratory: Normal respiratory effort without tachypnea nor retractions. Breath sounds are clear and equal bilaterally. No wheezes/rales/rhonchi. Gastrointestinal: Soft. No distention.  No guarding or rebound. Moderate right upper quadrant tenderness to palpation. Mild obesity.  Genitourinary/rectal:Deferred Musculoskeletal: Nontender with normal range of motion in all extremities. No joint effusions.  No lower extremity tenderness.  No edema. Neurologic:  Normal speech and language. No gross or focal neurologic deficits  are appreciated. Skin:  Skin is warm, dry and intact. No rash noted. Psychiatric: Mood and affect are normal. Speech and behavior are normal. Patient exhibits appropriate insight and judgment.   ____________________________________________  LABS (pertinent positives/negatives)  Labs Reviewed  COMPREHENSIVE METABOLIC PANEL - Abnormal; Notable for the following:       Result Value   Potassium 3.3 (*)    Glucose, Bld 112 (*)    Total Protein 8.7 (*)    AST 67 (*)    ALT 87 (*)    Alkaline Phosphatase 224 (*)    Total Bilirubin 2.4 (*)    All other components within normal limits  CBC - Abnormal; Notable for the following:    WBC 19.6 (*)    All other components within normal limits  URINALYSIS, COMPLETE (UACMP) WITH MICROSCOPIC - Abnormal; Notable for the following:    Color, Urine AMBER (*)    APPearance HAZY (*)    Hgb urine dipstick SMALL (*)    Protein, ur 100 (*)    Leukocytes, UA TRACE (*)    Squamous Epithelial / LPF 6-30 (*)    All other components within normal limits  BILIRUBIN, DIRECT - Abnormal; Notable for the following:    Bilirubin, Direct 0.8 (*)    All other components within normal limits  CULTURE, BLOOD (ROUTINE X 2)  CULTURE, BLOOD (ROUTINE X 2)  LIPASE, BLOOD  PREGNANCY, URINE    ____________________________________________    EKG I, Governor Rooks, MD, the attending physician have personally viewed and interpreted all ECGs.  None ____________________________________________  RADIOLOGY All Xrays were viewed by me. Imaging interpreted by Radiologist.  Ct abd pel:  IMPRESSION: Status post cholecystectomy. 6.9 x 4.3 cm fluid collection is noted in gallbladder fossa consistent with either biloma or seroma. Biliary stent is seen in grossly good position. Pneumobilia is noted in left hepatic lobe. __________________________________________  PROCEDURES  Procedure(s) performed: None  Critical Care performed:  None  ____________________________________________   ED COURSE / ASSESSMENT AND PLAN  Pertinent labs & imaging results that were available during my care of the patient were reviewed by me and considered in my medical decision making (see chart for details).    Ms. Catherine Kennedy is here for right upper quadrant pain with a history of biliary stent status post cholecystectomy. Her LFTs are elevated, her white blood count is 19,000. She does not appear septic, but I am going to cover her with Zosyn and it hepatic after discussion with general surgeon. CT scan was obtained for further evaluation, and shows biloma. Plan will be patient to be admitted to general surgery and likely to get interventional radiology drain.  In the emergency room, patient was treated with IV pain and nausea medication as well as IV fluids.    CONSULTATIONS:   Dr. Everlene Farrier, general surgery for admission. Patient / Family / Caregiver informed of clinical course, medical decision-making process, and agree with plan.  ___________________________________________   FINAL CLINICAL IMPRESSION(S) / ED DIAGNOSES   Final diagnoses:  Biliary obstruction  Biloma following surgery, initial encounter              Note: This dictation was prepared with Dragon dictation. Any transcriptional errors that result from this process are unintentional    Governor Rooks, MD 08/30/16 1439

## 2016-08-31 ENCOUNTER — Inpatient Hospital Stay: Payer: Self-pay

## 2016-08-31 DIAGNOSIS — B999 Unspecified infectious disease: Secondary | ICD-10-CM

## 2016-08-31 LAB — COMPREHENSIVE METABOLIC PANEL
ALT: 66 U/L — AB (ref 14–54)
AST: 40 U/L (ref 15–41)
Albumin: 3 g/dL — ABNORMAL LOW (ref 3.5–5.0)
Alkaline Phosphatase: 213 U/L — ABNORMAL HIGH (ref 38–126)
Anion gap: 6 (ref 5–15)
BILIRUBIN TOTAL: 2.2 mg/dL — AB (ref 0.3–1.2)
CO2: 25 mmol/L (ref 22–32)
CREATININE: 0.48 mg/dL (ref 0.44–1.00)
Calcium: 8.4 mg/dL — ABNORMAL LOW (ref 8.9–10.3)
Chloride: 105 mmol/L (ref 101–111)
GFR calc Af Amer: >60 mL/min (ref >60)
Glucose, Bld: 161 mg/dL — ABNORMAL HIGH (ref 65–99)
Potassium: 3.4 mmol/L — ABNORMAL LOW (ref 3.5–5.1)
Sodium: 136 mmol/L (ref 135–145)
Total Protein: 6.6 g/dL (ref 6.5–8.1)

## 2016-08-31 LAB — PROTIME-INR
INR: 1.12
Prothrombin Time: 14.5 seconds (ref 11.4–15.2)

## 2016-08-31 LAB — APTT: APTT: 38 s — AB (ref 24–36)

## 2016-08-31 MED ORDER — FENTANYL CITRATE (PF) 100 MCG/2ML IJ SOLN
INTRAMUSCULAR | Status: AC
  Filled 2016-08-31: qty 2

## 2016-08-31 MED ORDER — HYDROCODONE-ACETAMINOPHEN 5-325 MG PO TABS
ORAL_TABLET | ORAL | Status: AC
  Filled 2016-08-31: qty 1

## 2016-08-31 MED ORDER — DEXTROSE-NACL 5-0.9 % IV SOLN
INTRAVENOUS | Status: DC
  Administered 2016-08-31 (×2): via INTRAVENOUS

## 2016-08-31 MED ORDER — PIPERACILLIN-TAZOBACTAM 3.375 G IVPB
3.3750 g | Freq: Three times a day (TID) | INTRAVENOUS | Status: DC
  Administered 2016-08-31 – 2016-09-02 (×6): 3.375 g via INTRAVENOUS
  Filled 2016-08-31 (×9): qty 50

## 2016-08-31 MED ORDER — MIDAZOLAM HCL 2 MG/2ML IJ SOLN
INTRAMUSCULAR | Status: AC
  Filled 2016-08-31: qty 4

## 2016-08-31 MED ORDER — FENTANYL CITRATE (PF) 100 MCG/2ML IJ SOLN
INTRAMUSCULAR | Status: AC | PRN
  Administered 2016-08-31 (×3): 50 ug via INTRAVENOUS

## 2016-08-31 MED ORDER — MIDAZOLAM HCL 2 MG/2ML IJ SOLN
INTRAMUSCULAR | Status: AC | PRN
  Administered 2016-08-31 (×3): 1 mg via INTRAVENOUS

## 2016-08-31 MED ORDER — SODIUM CHLORIDE 0.9% FLUSH
5.0000 mL | Freq: Three times a day (TID) | INTRAVENOUS | Status: DC
  Administered 2016-08-31 – 2016-09-01 (×4): 5 mL

## 2016-08-31 NOTE — Consult Note (Signed)
Chief Complaint: Patient was seen in consultation today for  Chief Complaint  Patient presents with  . Abdominal Pain   at the request of * No referring provider recorded for this case *  Referring Physician(s): * No referring provider recorded for this case *  Supervising Physician: Jolaine Click  Patient Status: [facility] - In-pt  History of Present Illness: Catherine Kennedy is a 46 y.o. female who had a cholecystectomy 10 days ago and had a bile leak post op. She had a stent placed but recently developed fever and chills. CT revealed a GB fossa fluid collection. She is referred for a drain.  Past Medical History:  Diagnosis Date  . Acute cholecystitis 08/08/2016    Past Surgical History:  Procedure Laterality Date  . CHOLECYSTECTOMY N/A 08/08/2016   Procedure: LAPAROSCOPIC CHOLECYSTECTOMY;  Surgeon: Ancil Linsey, MD;  Location: [facility] ORS;  Service: General;  Laterality: N/A;  . ENDOSCOPIC RETROGRADE CHOLANGIOPANCREATOGRAPHY (ERCP) WITH PROPOFOL N/A 08/21/2016   Procedure: ENDOSCOPIC RETROGRADE CHOLANGIOPANCREATOGRAPHY (ERCP) WITH PROPOFOL;  Surgeon: Midge Minium, MD;  Location: [facility] ENDOSCOPY;  Service: Endoscopy;  Laterality: N/A;  . FINGER SURGERY      Allergies: Patient has no known allergies.  Medications: Prior to Admission medications   Not on File     Family History  Problem Relation Age of Onset  . Diabetes Father     Social History   Social History  . Marital status: Single    Spouse name: N/A  . Number of children: N/A  . Years of education: N/A   Social History Main Topics  . Smoking status: Never Smoker  . Smokeless tobacco: Never Used  . Alcohol use No  . Drug use: No  . Sexual activity: Not Asked   Other Topics Concern  . None   Social History Narrative  . None     Review of Systems: A 12 point ROS discussed and pertinent positives are indicated in the HPI above.  All other systems are negative.  Review of Systems  Vital  Signs: BP (!) 106/48 (BP Location: Right Arm)   Pulse 92   Temp 99.1 F (37.3 C) (Oral)   Resp (!) 22   Ht 4' 11.06" (1.5 m)   Wt 161 lb (73 kg)   LMP 08/02/2016 (Approximate) Comment: preg test neg  SpO2 100%   BMI 32.46 kg/m   Physical Exam  Constitutional: She is oriented to person, place, and time. She appears well-developed and well-nourished.  HENT:  Head: Normocephalic and atraumatic.  Cardiovascular: Normal rate and regular rhythm.   Pulmonary/Chest: Effort normal and breath sounds normal.  Neurological: She is alert and oriented to person, place, and time.    Mallampati Score:   1  Imaging: Nm Hepatobiliary Including Gb  Result Date: 08/20/2016 CLINICAL DATA:  Abdominal pain, nausea. Status post cholecystectomy 08-08-16. No vomiting or diarrhea.Ultrasound report from 08-19-16 showed possible biloma (fluid collection within GB fossa).NPO this am, no narcotics EXAM: NUCLEAR MEDICINE HEPATOBILIARY IMAGING TECHNIQUE: Sequential images of the abdomen were obtained out to 60 minutes following intravenous administration of radiopharmaceutical. RADIOPHARMACEUTICALS:  5.23 mCi Tc-70m  Choletec IV COMPARISON:  Ultrasound, 08/19/2016 FINDINGS: A small collection of radiotracer is seen during the middle to later phases of the exam, within the gallbladder fossa consistent with a bile leak. The liver shows homogeneous accumulation of radiotracer. There is prompt excretion into the intra and extrahepatic biliary tree with small bowel activity seen early during the exam confirming patency of the  common bile duct. IMPRESSION: 1. Small amount of radiotracer collects in the gallbladder fossa consistent with a bile leak. Electronically Signed   By: Amie Portland M.D.   On: 08/20/2016 11:15   Ct Abdomen Pelvis W Contrast  Result Date: 08/30/2016 CLINICAL DATA:  Right upper quadrant abdominal and flank pain. EXAM: CT ABDOMEN AND PELVIS WITH CONTRAST TECHNIQUE: Multidetector CT imaging of the abdomen  and pelvis was performed using the standard protocol following bolus administration of intravenous contrast. CONTRAST:  ISOVUE-300 IOPAMIDOL (ISOVUE-300) INJECTION 61% COMPARISON:  Ultrasound of Aug 09, 2016.  HIDA scan of Aug 20, 2016. FINDINGS: Lower chest: No acute abnormality. Hepatobiliary: Status post cholecystectomy. Biliary stent is seen extending from proximal common bile duct to duodenum. 6.9 x 4.3 cm fluid collection is noted in gallbladder fossa consistent with either biloma or seroma. Pneumobilia is noted in left hepatic lobe. Pancreas: Unremarkable. No pancreatic ductal dilatation or surrounding inflammatory changes. Spleen: Normal in size without focal abnormality. Adrenals/Urinary Tract: Adrenal glands are unremarkable. Kidneys are normal, without renal calculi, focal lesion, or hydronephrosis. Bladder is unremarkable. Stomach/Bowel: Stomach is within normal limits. Appendix appears normal. No evidence of bowel wall thickening, distention, or inflammatory changes. Vascular/Lymphatic: No significant vascular findings are present. No enlarged abdominal or pelvic lymph nodes. Reproductive: Uterus and bilateral adnexa are unremarkable. Other: No abdominal wall hernia or abnormality. No abdominopelvic ascites. Musculoskeletal: No acute or significant osseous findings. IMPRESSION: Status post cholecystectomy. 6.9 x 4.3 cm fluid collection is noted in gallbladder fossa consistent with either biloma or seroma. Biliary stent is seen in grossly good position. Pneumobilia is noted in left hepatic lobe. Electronically Signed   By: Lupita Raider, M.D.   On: 08/30/2016 14:22   Mr 3d Recon At Scanner  Addendum Date: 08/14/2016   ADDENDUM REPORT: 08/14/2016 12:36 ADDENDUM: These results were called by telephone at the time of interpretation on 08/14/2016 at 12:35 pm to Dr. Satira Mccallum , who verbally acknowledged these results. Based on discussion of the clinical history and surgical procedure, these  findings are most compatible with postoperative seroma. The nondependent susceptibility artifact likely reflects gas and/or a surgical clip. Notably, the common duct measures 7 mm, within normal limits for postoperative state, and no choledocholithiasis is visualized. Electronically Signed   By: Charline Bills M.D.   On: 08/14/2016 12:36   Result Date: 08/14/2016 CLINICAL DATA:  Status post cholecystectomy on 08/08/2016, continued upper abdominal pain, evaluate for stone EXAM: MRI ABDOMEN WITHOUT AND WITH CONTRAST (INCLUDING MRCP) TECHNIQUE: Multiplanar multisequence MR imaging of the abdomen was performed both before and after the administration of intravenous contrast. Heavily T2-weighted images of the biliary and pancreatic ducts were obtained, and three-dimensional MRCP images were rendered by post processing. CONTRAST:  14 mL Multihance IV COMPARISON:  Right upper quadrant ultrasound dated 08/07/2016 FINDINGS: Lower chest: Lung bases are clear. Hepatobiliary: Liver is within normal limits. No suspicious/ enhancing hepatic lesions. Status post cholecystectomy. 4.7 x 8.0 x 5.4 cm mildly thick-walled fluid collection with thin enhancing rim in the gallbladder fossa (series 10/ image 25), with scattered foci of susceptibility/gas. Dependent susceptibility artifact along the posterior aspect of the collection could reflect a calculus rather than gas given a nondependent position (series 10/ image 24), but nondependent gas is favored given suspected adjacent pneumobilia (series 10/ image 24). Overall, this appearance may reflect a postoperative seroma, although given the extent, bile leak/biloma is also possible. No intrahepatic or extrahepatic ductal dilatation. Pancreas:  Within normal limits. Spleen:  Within  normal limits. Adrenals/Urinary Tract:  Adrenal glands are within normal limits. Kidneys are within normal limits.  No hydronephrosis. Stomach/Bowel: Stomach is within normal limits. Visualized bowel is  unremarkable. Vascular/Lymphatic:  No evidence of abdominal aortic aneurysm. No suspicious abdominal lymphadenopathy. Other:  No abdominal ascites. Mild postsurgical changes along the right anterior abdominal wall. Musculoskeletal: No focal osseous lesions. IMPRESSION: Status post cholecystectomy. 4.7 x 8.0 x 5.4 cm mildly thick-walled fluid collection in the gallbladder fossa, possibly reflecting a postoperative seroma. However, given the extent, bile leak/biloma is also possible. Consider hepatobiliary nuclear medicine scan to exclude bile leak as clinically warranted. Dependent susceptibility artifact within the collection may reflect a retained calculus, although nondependent gas is favored given adjacent pneumobilia. If differentiation is essential, consider CT. Electronically Signed: By: Charline Bills M.D. On: 08/14/2016 11:20   Dg C-arm 1-60 Min-no Report  Result Date: 08/21/2016 Fluoroscopy was utilized by the requesting physician.  No radiographic interpretation.   Mr Abdomen Mrcp Vivien Rossetti Contast  Addendum Date: 08/14/2016   ADDENDUM REPORT: 08/14/2016 12:36 ADDENDUM: These results were called by telephone at the time of interpretation on 08/14/2016 at 12:35 pm to Dr. Satira Mccallum , who verbally acknowledged these results. Based on discussion of the clinical history and surgical procedure, these findings are most compatible with postoperative seroma. The nondependent susceptibility artifact likely reflects gas and/or a surgical clip. Notably, the common duct measures 7 mm, within normal limits for postoperative state, and no choledocholithiasis is visualized. Electronically Signed   By: Charline Bills M.D.   On: 08/14/2016 12:36   Result Date: 08/14/2016 CLINICAL DATA:  Status post cholecystectomy on 08/08/2016, continued upper abdominal pain, evaluate for stone EXAM: MRI ABDOMEN WITHOUT AND WITH CONTRAST (INCLUDING MRCP) TECHNIQUE: Multiplanar multisequence MR imaging of the abdomen was  performed both before and after the administration of intravenous contrast. Heavily T2-weighted images of the biliary and pancreatic ducts were obtained, and three-dimensional MRCP images were rendered by post processing. CONTRAST:  14 mL Multihance IV COMPARISON:  Right upper quadrant ultrasound dated 08/07/2016 FINDINGS: Lower chest: Lung bases are clear. Hepatobiliary: Liver is within normal limits. No suspicious/ enhancing hepatic lesions. Status post cholecystectomy. 4.7 x 8.0 x 5.4 cm mildly thick-walled fluid collection with thin enhancing rim in the gallbladder fossa (series 10/ image 25), with scattered foci of susceptibility/gas. Dependent susceptibility artifact along the posterior aspect of the collection could reflect a calculus rather than gas given a nondependent position (series 10/ image 24), but nondependent gas is favored given suspected adjacent pneumobilia (series 10/ image 24). Overall, this appearance may reflect a postoperative seroma, although given the extent, bile leak/biloma is also possible. No intrahepatic or extrahepatic ductal dilatation. Pancreas:  Within normal limits. Spleen:  Within normal limits. Adrenals/Urinary Tract:  Adrenal glands are within normal limits. Kidneys are within normal limits.  No hydronephrosis. Stomach/Bowel: Stomach is within normal limits. Visualized bowel is unremarkable. Vascular/Lymphatic:  No evidence of abdominal aortic aneurysm. No suspicious abdominal lymphadenopathy. Other:  No abdominal ascites. Mild postsurgical changes along the right anterior abdominal wall. Musculoskeletal: No focal osseous lesions. IMPRESSION: Status post cholecystectomy. 4.7 x 8.0 x 5.4 cm mildly thick-walled fluid collection in the gallbladder fossa, possibly reflecting a postoperative seroma. However, given the extent, bile leak/biloma is also possible. Consider hepatobiliary nuclear medicine scan to exclude bile leak as clinically warranted. Dependent susceptibility  artifact within the collection may reflect a retained calculus, although nondependent gas is favored given adjacent pneumobilia. If differentiation is essential, consider CT. Electronically Signed:  By: Charline BillsSriyesh  Krishnan M.D. On: 08/14/2016 11:20   [place] Abdomen Limited Ruq  Result Date: 08/19/2016 CLINICAL DATA:  Initial evaluation for acute abdominal pain, recent cholecystectomy. EXAM: US ABDOMEN LIMITED - RIGHT UPPER QUADRANT COMPARISON:  Prior MRI from 08/14/2016. FINDINGS: Gallbladder: Surgically absent. 4.9 x 4.4 x 6.7 cm complex fluid collection present within the gallbladder fossa. This is similar to previous. Common bile duct: Diameter: 7.6 mm Liver: No focal lesion identified. Within normal limits in parenchymal echogenicity. IMPRESSION: 4.9 x 4.4 x 6.7 cm complex collection. Again, finding may reflect postoperative seroma or possibly biloma. This was present on recent MRI from 08/14/2016. Electronically Signed   By: Rise MuBenjamin  McClintock M.D.   On: 08/19/2016 17:26   [place] Abdomen Limited Ruq  Result Date: 08/07/2016 CLINICAL DATA:  46 year old female with right upper quadrant abdominal pain for 1 day. Initial encounter. EXAM: US ABDOMEN LIMITED - RIGHT UPPER QUADRANT COMPARISON:  None. FINDINGS: Gallbladder: A 2.2 cm calculus at the gallbladder neck is noted. Gallbladder wall thickening is present. No definite sonographic Murphy sign or pericholecystic fluid noted. Common bile duct: Diameter: 4.3 mm. There is no evidence of intrahepatic or extrahepatic biliary dilatation. Liver: No focal lesion identified. Within normal limits in parenchymal echogenicity. IMPRESSION: 2.2 cm calculus at the gallbladder neck with gallbladder wall thickening- likely representing acute cholecystitis. No evidence of biliary dilatation. Electronically Signed   By: Harmon PierJeffrey  Hu M.D.   On: 08/07/2016 17:00    Labs:  CBC:  Recent Labs  08/20/16 1311 08/21/16 0454 08/22/16 0506 08/30/16 1010  WBC 9.0 7.8 13.1* 19.6*    HGB 14.1 12.6 12.5 13.3  HCT 39.5 35.2 34.9* 38.4  PLT 496* 482* 523* 377    COAGS:  Recent Labs  08/20/16 1311 08/31/16 0452  INR 0.97 1.12  APTT 32 38*    BMP:  Recent Labs  08/21/16 0454 08/22/16 0506 08/30/16 1010 08/31/16 0452  NA 139 138 135 136  K 4.0 3.5 3.3* 3.4*  CL 105 104 102 105  CO2 26 26 25 25   GLUCOSE 120* 112* 112* 161*  BUN 9 7 8  <5*  CALCIUM 8.6* 9.0 9.1 8.4*  CREATININE 0.51 0.53 0.51 0.48  GFRNONAA >60 >60 >60 >60  GFRAA >60 >60 >60 >60    LIVER FUNCTION TESTS:  Recent Labs  08/21/16 0454 08/22/16 0506 08/30/16 1010 08/31/16 0452  BILITOT 0.9 1.0 2.4* 2.2*  AST 37 30 67* 40  ALT 48 51 87* 66*  ALKPHOS 188* 207* 224* 213*  PROT 6.8 7.2 8.7* 6.6  ALBUMIN 3.2* 3.4* 4.1 3.0*    TUMOR MARKERS: No results for input(s): AFPTM, CEA, CA199, CHROMGRNA in the last 8760 hours.  Assessment and Plan:  GB fossa fluid collection. Drain to follow. Interpreter was present.  Thank you for this interesting consult.  I greatly enjoyed meeting Lacinda Axonora Lopez Lopez and look forward to participating in their care.  A copy of this report was sent to the requesting provider on this date.  Electronically Signed: Keenon Leitzel, ART A, MD 08/31/2016, 11:02 AM   I spent a total of 40 Minutes  in face to face in clinical consultation, greater than 50% of which was counseling/coordinating care for gallbladder fossa drain.

## 2016-08-31 NOTE — Care Management (Signed)
Admitted to [facility] with the diagnosis of Biloma following surgery.  Discharged from this facility 08/22/16 following Laparoscopic Gallbladder surgery. Lives with friend, Eulis Fosterlberto [phone]). Given information about [facility] and Medication Management last visit. Also, given Good rx coupons for medications.  Medical Record review indicates at [facility]. [facility]. Last seen Dr. Earlene Plateravis 08/25/16.  Gwenette GreetBrenda S Shatyra Becka RN MSN CCM Care Management [phone]

## 2016-08-31 NOTE — Progress Notes (Signed)
CC: biloma Subjective: Feeling better, some intermittent abd pain,. No N/V Did spike fever yesterday D/W  Interventional radiologist about drain placement. It will be done today under CT guidance D/W Dr. Servando SnareWohl in detail, if she does not improve may need to revise patency of stent LFT mildly elevated as well as AP  Objective: Vital signs in last 24 hours: Temp:  [98.5 F (36.9 C)-101.6 F (38.7 C)] 99.1 F (37.3 C) (05/29 0527) Pulse Rate:  [92-113] 92 (05/29 0527) Resp:  [18-22] 22 (05/29 0527) BP: (94-115)/(48-82) 106/48 (05/29 0527) SpO2:  [99 %-100 %] 100 % (05/29 0527) Weight:  [73 kg (161 lb)] 73 kg (161 lb) (05/28 1004) Last BM Date: 08/30/16  Intake/Output from previous day: 05/28 0701 - 05/29 0700 In: 1179 [I.V.:874; IV Piggyback:305] Out: 1600 [Urine:1600] Intake/Output this shift: Total I/O In: 1138 [I.V.:1138] Out: -   Physical exam: NAD, awake , alert Abd: soft, mild TTP RUQ, no peritonitis, incisions c/d/i. Ext: well perfused, no edema  Lab Results: CBC   Recent Labs  08/30/16 1010  WBC 19.6*  HGB 13.3  HCT 38.4  PLT 377   BMET  Recent Labs  08/30/16 1010 08/31/16 0452  NA 135 136  K 3.3* 3.4*  CL 102 105  CO2 25 25  GLUCOSE 112* 161*  BUN 8 <5*  CREATININE 0.51 0.48  CALCIUM 9.1 8.4*   PT/INR  Recent Labs  08/31/16 0452  LABPROT 14.5  INR 1.12   ABG No results for input(s): PHART, HCO3 in the last 72 hours.  Invalid input(s): PCO2, PO2  Studies/Results: Ct Abdomen Pelvis W Contrast  Result Date: 08/30/2016 CLINICAL DATA:  Right upper quadrant abdominal and flank pain. EXAM: CT ABDOMEN AND PELVIS WITH CONTRAST TECHNIQUE: Multidetector CT imaging of the abdomen and pelvis was performed using the standard protocol following bolus administration of intravenous contrast. CONTRAST:  100mL ISOVUE-300 IOPAMIDOL (ISOVUE-300) INJECTION 61% COMPARISON:  Ultrasound of Aug 09, 2016.  HIDA scan of Aug 20, 2016. FINDINGS: Lower chest: No acute  abnormality. Hepatobiliary: Status post cholecystectomy. Biliary stent is seen extending from proximal common bile duct to duodenum. 6.9 x 4.3 cm fluid collection is noted in gallbladder fossa consistent with either biloma or seroma. Pneumobilia is noted in left hepatic lobe. Pancreas: Unremarkable. No pancreatic ductal dilatation or surrounding inflammatory changes. Spleen: Normal in size without focal abnormality. Adrenals/Urinary Tract: Adrenal glands are unremarkable. Kidneys are normal, without renal calculi, focal lesion, or hydronephrosis. Bladder is unremarkable. Stomach/Bowel: Stomach is within normal limits. Appendix appears normal. No evidence of bowel wall thickening, distention, or inflammatory changes. Vascular/Lymphatic: No significant vascular findings are present. No enlarged abdominal or pelvic lymph nodes. Reproductive: Uterus and bilateral adnexa are unremarkable. Other: No abdominal wall hernia or abnormality. No abdominopelvic ascites. Musculoskeletal: No acute or significant osseous findings. IMPRESSION: Status post cholecystectomy. 6.9 x 4.3 cm fluid collection is noted in gallbladder fossa consistent with either biloma or seroma. Biliary stent is seen in grossly good position. Pneumobilia is noted in left hepatic lobe. Electronically Signed   By: Lupita RaiderJames  Green Jr, M.D.   On: 08/30/2016 14:22    Anti-infectives: Anti-infectives    Start     Dose/Rate Route Frequency Ordered Stop   08/31/16 0930  piperacillin-tazobactam (ZOSYN) IVPB 3.375 g     3.375 g 12.5 mL/hr over 240 Minutes Intravenous Every 8 hours 08/31/16 0915     08/30/16 1445  Ampicillin-Sulbactam (UNASYN) 3 g in sodium chloride 0.9 % 100 mL IVPB  Status:  Discontinued  3 g 200 mL/hr over 30 Minutes Intravenous Every 6 hours 08/30/16 1441 08/31/16 0915   08/30/16 1245  piperacillin-tazobactam (ZOSYN) IVPB 3.375 g     3.375 g 100 mL/hr over 30 Minutes Intravenous  Once 08/30/16 1245 08/30/16 1443       Assessment/Plan: Biloma for drain placement continue A/B  No surgical intervention If continues to be febrile may need to check for stent patency  Sterling Big, MD, FACS  08/31/2016

## 2016-08-31 NOTE — Progress Notes (Signed)
Lab notified RN that pt has blood culture orders still pending due to recent fail attempts. RN notified on call surgeon regarding order, new orders to d/c blood cultures at this time.   Somaly Marteney [place]

## 2016-09-01 DIAGNOSIS — T814XXA Infection following a procedure, initial encounter: Principal | ICD-10-CM

## 2016-09-01 LAB — COMPREHENSIVE METABOLIC PANEL
ALT: 65 U/L — ABNORMAL HIGH (ref 14–54)
AST: 38 U/L (ref 15–41)
Albumin: 3.1 g/dL — ABNORMAL LOW (ref 3.5–5.0)
Alkaline Phosphatase: 235 U/L — ABNORMAL HIGH (ref 38–126)
Anion gap: 8 (ref 5–15)
BILIRUBIN TOTAL: 1.2 mg/dL (ref 0.3–1.2)
CHLORIDE: 102 mmol/L (ref 101–111)
CO2: 26 mmol/L (ref 22–32)
Calcium: 8.8 mg/dL — ABNORMAL LOW (ref 8.9–10.3)
Creatinine, Ser: 0.55 mg/dL (ref 0.44–1.00)
Glucose, Bld: 185 mg/dL — ABNORMAL HIGH (ref 65–99)
POTASSIUM: 3.9 mmol/L (ref 3.5–5.1)
Sodium: 136 mmol/L (ref 135–145)
TOTAL PROTEIN: 7.1 g/dL (ref 6.5–8.1)

## 2016-09-01 LAB — CBC
HEMATOCRIT: 33.5 % — AB (ref 35.0–47.0)
Hemoglobin: 11.9 g/dL — ABNORMAL LOW (ref 12.0–16.0)
MCH: 31.4 pg (ref 26.0–34.0)
MCHC: 35.4 g/dL (ref 32.0–36.0)
MCV: 88.6 fL (ref 80.0–100.0)
Platelets: 306 10*3/uL (ref 150–440)
RBC: 3.79 MIL/uL — AB (ref 3.80–5.20)
RDW: 14.1 % (ref 11.5–14.5)
WBC: 10.1 10*3/uL (ref 3.6–11.0)

## 2016-09-01 NOTE — Progress Notes (Signed)
CC: Abscess Subjective: ) By interventional radiology showed frank purulence. Patient still having some intermittent abdominal pain and some nausea. White count with significant improvement  Objective: Vital signs in last 24 hours: Temp:  [97.7 F (36.5 C)-99.1 F (37.3 C)] 97.7 F (36.5 C) (05/30 0543) Pulse Rate:  [81-99] 81 (05/30 0543) Resp:  [11-30] 18 (05/30 0543) BP: (111-128)/(59-90) 111/63 (05/30 0543) SpO2:  [94 %-100 %] 100 % (05/30 0543) Last BM Date: 08/31/16  Intake/Output from previous day: 05/29 0701 - 05/30 0700 In: 2836 [I.V.:2681; IV Piggyback:145] Out: 40 [Drains:40] Intake/Output this shift: No intake/output data recorded.  Physical exam: NAD, awake alert Abd: soft, mild TTP RUQ, no peritonitis. Drain w serosanguinous fluid Ext: well perfused, no edema.  Lab Results: CBC   Recent Labs  08/30/16 1010 09/01/16 0629  WBC 19.6* 10.1  HGB 13.3 11.9*  HCT 38.4 33.5*  PLT 377 306   BMET  Recent Labs  08/31/16 0452 09/01/16 0629  NA 136 136  K 3.4* 3.9  CL 105 102  CO2 25 26  GLUCOSE 161* 185*  BUN <5* <5*  CREATININE 0.48 0.55  CALCIUM 8.4* 8.8*   PT/INR  Recent Labs  08/31/16 0452  LABPROT 14.5  INR 1.12   ABG No results for input(s): PHART, HCO3 in the last 72 hours.  Invalid input(s): PCO2, PO2  Studies/Results: Ct Abdomen Pelvis W Contrast  Result Date: 08/30/2016 CLINICAL DATA:  Right upper quadrant abdominal and flank pain. EXAM: CT ABDOMEN AND PELVIS WITH CONTRAST TECHNIQUE: Multidetector CT imaging of the abdomen and pelvis was performed using the standard protocol following bolus administration of intravenous contrast. CONTRAST:  ISOVUE-300 IOPAMIDOL (ISOVUE-300) INJECTION 61% COMPARISON:  Ultrasound of Aug 09, 2016.  HIDA scan of Aug 20, 2016. FINDINGS: Lower chest: No acute abnormality. Hepatobiliary: Status post cholecystectomy. Biliary stent is seen extending from proximal common bile duct to duodenum. 6.9 x 4.3  cm fluid collection is noted in gallbladder fossa consistent with either biloma or seroma. Pneumobilia is noted in left hepatic lobe. Pancreas: Unremarkable. No pancreatic ductal dilatation or surrounding inflammatory changes. Spleen: Normal in size without focal abnormality. Adrenals/Urinary Tract: Adrenal glands are unremarkable. Kidneys are normal, without renal calculi, focal lesion, or hydronephrosis. Bladder is unremarkable. Stomach/Bowel: Stomach is within normal limits. Appendix appears normal. No evidence of bowel wall thickening, distention, or inflammatory changes. Vascular/Lymphatic: No significant vascular findings are present. No enlarged abdominal or pelvic lymph nodes. Reproductive: Uterus and bilateral adnexa are unremarkable. Other: No abdominal wall hernia or abnormality. No abdominopelvic ascites. Musculoskeletal: No acute or significant osseous findings. IMPRESSION: Status post cholecystectomy. 6.9 x 4.3 cm fluid collection is noted in gallbladder fossa consistent with either biloma or seroma. Biliary stent is seen in grossly good position. Pneumobilia is noted in left hepatic lobe. Electronically Signed   By: Lupita Raider, M.D.   On: 08/30/2016 14:22   Ct Image Guided Drainage By Percutaneous Catheter  Result Date: 08/31/2016 INDICATION: Gallbladder fossa abscess EXAM: CT GUIDED DRAINAGE OF GALLBLADDER FOSSA ABSCESS MEDICATIONS: The patient is currently admitted to the hospital and receiving intravenous antibiotics. The antibiotics were administered within an appropriate time frame prior to the initiation of the procedure. ANESTHESIA/SEDATION: Three mg IV Versed 150 mcg IV Fentanyl Moderate Sedation Time:  16 The patient was continuously monitored during the procedure by the interventional radiology nurse under my direct supervision. COMPLICATIONS: None immediate. TECHNIQUE: Informed written consent was obtained from the patient after a thorough discussion of the procedural risks,  benefits and alternatives. All questions were addressed. Maximal Sterile Barrier Technique was utilized including caps, mask, sterile gowns, sterile gloves, sterile drape, hand hygiene and skin antiseptic. A timeout was performed prior to the initiation of the procedure. PROCEDURE: The right upper quadrant was prepped with ChloraPrep in a sterile fashion, and a sterile drape was applied covering the operative field. A sterile gown and sterile gloves were used for the procedure. Local anesthesia was provided with 1% Lidocaine. Under CT guidance, an 18 gauge needle was inserted into the gallbladder fossa and removed over an Amplatz wire. A 10 French dilator followed by a 10 [place]French drain were inserted. It was looped and string fixed in the fluid collection, then sewn to the skin. Frank pus was aspirated. FINDINGS: Images document 2110 French drain placement into a gallbladder fossa abscess. IMPRESSION: Successful gallbladder fossa abscess drain yielding pus. Electronically Signed   By: Jolaine ClickArthur  Hoss M.D.   On: 08/31/2016 12:02    Anti-infectives: Anti-infectives    Start     Dose/Rate Route Frequency Ordered Stop   08/31/16 0930  piperacillin-tazobactam (ZOSYN) IVPB 3.375 g     3.375 g 12.5 mL/hr over 240 Minutes Intravenous Every 8 hours 08/31/16 0915     08/30/16 1445  Ampicillin-Sulbactam (UNASYN) 3 g in sodium chloride 0.9 % 100 mL IVPB  Status:  Discontinued     3 g 200 mL/hr over 30 Minutes Intravenous Every 6 hours 08/30/16 1441 08/31/16 0915   08/30/16 1245  piperacillin-tazobactam (ZOSYN) IVPB 3.375 g     3.375 g 100 mL/hr over 30 Minutes Intravenous  Once 08/30/16 1245 08/30/16 1443      Assessment/Plan: Intra-abdominal abscess continue IV a/bs and drain No surgical intervention Hopefully DC in am Sterling Bigiego Lauree Yurick, MD, FACS  09/01/2016

## 2016-09-02 MED ORDER — HYDROCODONE-ACETAMINOPHEN 5-325 MG PO TABS: 1.0000 | ORAL_TABLET | ORAL | 0 refills | Status: DC | PRN

## 2016-09-02 MED ORDER — CIPROFLOXACIN HCL 500 MG PO TABS: 500.0000 mg | ORAL_TABLET | Freq: Two times a day (BID) | ORAL | 0 refills | Status: DC

## 2016-09-02 MED ORDER — METRONIDAZOLE 500 MG PO TABS: 500.0000 mg | ORAL_TABLET | Freq: Three times a day (TID) | ORAL | 0 refills | Status: DC

## 2016-09-02 NOTE — Discharge Instructions (Signed)
Gua para el hogar sobre el catter de drenaje biliar (Biliary Drainage Catheter Home Guide) Un catter de drenaje biliar es un tubo que se inserta a travs de la piel en las vas biliares del hgado. El propsito de un catter de drenaje biliar es prevenir la acumulacin de bilis en el hgado. La bilis es un lquido espeso amarillo o verde que ayuda a Location manager la grasa de los alimentos. La acumulacin de bilis puede ocurrir cuando hay un bloqueo que impide que la bilis pase de las vas biliares hacia el intestino delgado, como debera ocurrir normalmente. La causa de esto puede ser un tumor, clculos biliares o tejido Designer, television/film set. Existen tres tipos de drenaje biliar:  Drenaje biliar externo: con este tipo, la bilis solo se drena dentro de una bolsa de recoleccin fuera de su cuerpo (bolsa de recoleccin externa).  Drenaje biliar interno-externo: la bilis se drena hacia una bolsa de recoleccin externa y hacia el intestino delgado.  Drenaje biliar interno: la bilis solo se drena hacia el intestino delgado. CMO CAMBIO MI VENDAJE? El vendaje del drenaje se debe cambiar, como [place], da por medio o con ms frecuencia si es necesario que el vendaje se mantenga seco. 1. Lvese las manos con agua y [place]. 2. Retire con cuidado el vendaje usado. Evite usar tijera para retirar el vendaje porque esto podra ocasionar un dao accidental al drenaje. 3. Una vez que retira el vendaje, inspeccione la piel alrededor del drenaje para asegurarse de que no haya enrojecimiento, hinchazn ni una secrecin amarilla o verde de mal olor. 4. Si el drenaje est suturado a la piel, inspeccione la sutura para verificar que todava est sujetado a la piel. 5. Limpie la piel alrededor del lugar de insercin con jabn suave y agua tibia. Seque bien el rea con un pao limpio dando golpecitos. 6. No aplique cremas, ungentos ni alcohol en el lugar. Deje que la piel se seque al aire por completo antes de aplicar un nuevo  vendaje. 7. Use una esponja de drenaje (gasa dividida 4x4) y coloque el drenaje a travs de la hendidura. Cubra el drenaje y la primera gasa con una gasa 4x4. El drenaje debe apoyarse en la gasa y no en la piel. 8. Pegue el drenaje a la piel con cinta adhesiva. 9. Lvese las manos con agua y [place]. CMO LAVO UN DRENAJE QUE NO EST SUJETADO A UNA BOLSA? Los drenajes biliares se deben lavar todos los [place], salvo que un mdico le haya indicado lo contrario. El extremo del drenaje se cierra con un tapn intravenoso (IV) al cual se conecta directamente [place]. 1. Limpie el tapn intravenoso (IV) con un hisopo embebido en alcohol y luego enrosque la punta de una jeringa con solucin salina normal de 10ml al tapn intravenoso (IV). 2. Inyecte la solucin salina durante 5 a 10segundos. Si siente resistencia durante la inyeccin, detngase de inmediato. 3. Retire la jeringa del tapn. CMO SUJETO UNA BOLSA A MI DRENAJE? Si tiene problemas con su drenaje biliar, es posible que su mdico le indique usar un drenaje con bolsa hasta que puedan revisarlo y Radio producer problema. Por este motivo, siempre debe tener una bolsa de drenaje y un tubo de conexin en su casa. Si no los tiene, recuerde pedirlos en su prxima cita. 1. Retire la bolsa y el tubo de conexin de su envase. 2. Conecte el extremo del embudo del tubo al vstago en forma de cono de la bolsa. 3. Para retirar el tapn intravenoso (IV) del drenaje  biliar, desenrsquelo y luego reemplcelo con el extremo del tubo de rosca. 4. Guarde el tapn intravenoso (IV) en una bolsa de almacenamiento de plstico con cierre. CMO VACO MI BOLSA DE DRENAJE? La bolsa de drenaje se debe vaciar cuando se llena hasta 2/3 o antes de irse a dormir. La mayora de las bolsas de drenaje tiene una vlvula de drenaje en la parte inferior que permite vaciarlas con facilidad. 1. Sostenga la bolsa sobre el inodoro o el recipiente (o el envase de medicin, si le indican  que debe medir el drenaje). 2. Desenrosque la vlvula para abrirla y deje que la bolsa se drene. 3. Cierre bien la vlvula para evitar prdidas y lmpiela con un pauelo o apsito desechable. SOLICITE ATENCIN MDICA SI:  El dolor empeora despus de una mejora inicial.  Tiene dudas relacionadas con el tubo.  El enrojecimiento, Chief Technology Officerel dolor o la Paramedichinchazn en el lugar de la insercin del tubo empeoran a pesar de haber realizado una buena higiene.  La piel se abre alrededor del tubo.  Observa una supuracin de bilis alrededor del tubo.  El tubo se obstruye.  Su catter se desconecta o se sale.  Tiene fiebre.  Siente escalofros o Community education officerel dolor empeora.  ASEGRESE DE QUE:  Comprende estas instrucciones.  Controlar su afeccin.  Recibir ayuda de inmediato si no mejora o si empeora.  Esta informacin no tiene Theme park managercomo fin reemplazar el consejo del mdico. Asegrese de hacerle al mdico cualquier pregunta que tenga. Document Released: 01/10/2013 Document Revised: 03/27/2013 Document Reviewed: 10/30/2015 Elsevier Interactive Patient Education  2017 [employer]

## 2016-09-02 NOTE — Care Management (Signed)
Patient to discharge home today.  Patient patient [facility].  Patient to discharge with cipro, flagyl, Vicodin.   Cipro $4, Flagyl $10, and Vicodin $21.

## 2016-09-02 NOTE — Discharge Summary (Signed)
  Patient ID: Catherine Kennedy MRN: [id number] DOB/AGE: 45-Apr-1972 45 y.o.  Admit date: 08/30/2016 Discharge date: 09/02/2016   Discharge Diagnoses:  Active Problems:   Biloma following surgery   Infection   Postprocedural intraabdominal abscess   Procedures:Ct guided perc drainage of Intra-abdominal abscess  Hospital Course: 46 year old female well known to us status post laparoscopic cholecystectomy complicated by biloma and underwent an uneventful ERCP presented again with abdominal pain and a CT scan showing a persistent collection in the gallbladder fossa. We admitted her and start IV antibiotics and ask aeration radiology to place a drain. And a drain was placed and there was an obvious abscess with purulence. The patient did very well after the procedure and at the time of discharge she was ambulating, tolerating regular diet her vital signs were stable she was afebrile and her white count was normal. Her physical exam at discharge showed a female in no acute distress. Awake and alert. Abdomen: Soft, nontender no peritonitis incisions healing well. JP drain in place with serosanguineous drainage. Extremities: No edema and well perfused.  She will sent home on a seven-day week of antibiotic and we will continue her drain Condition at time of discharge is stable   Disposition: 01-Home or Self Care  Discharge Instructions    Call MD for:  difficulty breathing, headache or visual disturbances    Complete by:  As directed    Call MD for:  extreme fatigue    Complete by:  As directed    Call MD for:  hives    Complete by:  As directed    Call MD for:  persistant dizziness or light-headedness    Complete by:  As directed    Call MD for:  persistant nausea and vomiting    Complete by:  As directed    Call MD for:  redness, tenderness, or signs of infection (pain, swelling, redness, odor or green/yellow discharge around incision site)    Complete by:  As directed    Call MD for:   severe uncontrolled pain    Complete by:  As directed    Call MD for:  temperature >100.4    Complete by:  As directed    Diet - low sodium heart healthy    Complete by:  As directed    Discharge instructions    Complete by:  As directed    Teach pt about JP care   Increase activity slowly    Complete by:  As directed      Allergies as of 09/02/2016   No Known Allergies     Medication List    TAKE these medications   ciprofloxacin 500 MG tablet Commonly known as:  CIPRO Take 1 tablet (500 mg total) by mouth 2 (two) times daily.   HYDROcodone-acetaminophen 5-325 MG tablet Commonly known as:  NORCO/VICODIN Take 1-2 tablets by mouth every 4 (four) hours as needed for moderate pain.   metroNIDAZOLE 500 MG tablet Commonly known as:  FLAGYL Take 1 tablet (500 mg total) by mouth 3 (three) times daily.      Follow-up Information    Leafy RoPabon, Catherine F, MD. Go on 09/09/2016.   Specialty:  General Surgery Why:  Thursday at 1:30pm for hospital follow-up Contact information: [address] [place] [address] [phone]            Catherine Bigiego Pabon, MD FACS

## 2016-09-02 NOTE — Progress Notes (Addendum)
Discharge order received. Patient is alert and oriented. Vital signs stable . No signs of acute distress. Discharge instructions given,including JP care and dressing change to JP, Per, Dr. Everlene FarrierPabon, patient does not need to flush JP drain and does not need prescription (NS)for it. Patient and family verbalized understanding. No other issues noted at this time. Instructed to call Dr's office if concern arises.

## 2016-09-05 LAB — AEROBIC/ANAEROBIC CULTURE W GRAM STAIN (SURGICAL/DEEP WOUND)

## 2016-09-05 LAB — AEROBIC/ANAEROBIC CULTURE (SURGICAL/DEEP WOUND)

## 2016-09-09 ENCOUNTER — Encounter: Payer: Self-pay | Admitting: Surgery

## 2016-09-09 ENCOUNTER — Ambulatory Visit (INDEPENDENT_AMBULATORY_CARE_PROVIDER_SITE_OTHER): Payer: Self-pay | Admitting: Surgery

## 2016-09-09 VITALS — BP 123/78 | HR 67 | Temp 98.1°F | Ht 59.0 in | Wt 161.0 lb

## 2016-09-09 DIAGNOSIS — Z09 Encounter for follow-up examination after completed treatment for conditions other than malignant neoplasm: Secondary | ICD-10-CM

## 2016-09-09 NOTE — Patient Instructions (Signed)
We will see you back in 3 weeks. Please see scheduled appointments.  If you develop any abdominal pain, fever or chills, constipation or diarrhea- Call our office immediately and speak to myself or one of the other nurses.

## 2016-09-09 NOTE — Progress Notes (Signed)
S/p ERCP and stent placement for biloma. Needed Ct guided drain . Doing very well , minimal output from drain Taking Po no fevers   PE NAD Abd: soft, drain removed ( scant serous fluid) no peritonitis, incisions c/d/i.  A/p Doing well No surgical issues Completed a/bs F/u w dr. Earlene Plateravis 2-3 weeks

## 2016-09-27 ENCOUNTER — Ambulatory Visit (INDEPENDENT_AMBULATORY_CARE_PROVIDER_SITE_OTHER): Payer: Self-pay | Admitting: Surgery

## 2016-09-27 ENCOUNTER — Encounter: Payer: Self-pay | Admitting: Surgery

## 2016-09-27 VITALS — BP 120/82 | HR 61 | Temp 98.0°F | Ht 59.0 in | Wt 165.6 lb

## 2016-09-27 DIAGNOSIS — T8189XS Other complications of procedures, not elsewhere classified, sequela: Secondary | ICD-10-CM

## 2016-09-27 DIAGNOSIS — K668 Other specified disorders of peritoneum: Secondary | ICD-10-CM

## 2016-09-27 DIAGNOSIS — K8000 Calculus of gallbladder with acute cholecystitis without obstruction: Secondary | ICD-10-CM

## 2016-09-27 NOTE — Patient Instructions (Signed)
You may resume your normal activities at this time. Please call our office if you have questions or concerns.

## 2016-09-27 NOTE — Progress Notes (Signed)
Surgical Clinic Progress/Follow-up Note   HPI:  46 y.o. Female presents to clinic for follow-up evaluation s/p removal of subhepatic drain for duct of Luschka-associated biloma s/p otherwise uncomplicated laparoscopic cholecystectomy for acute calculous cholecystitis. Patient reports tolerating a regular diet and regular BM's/+flatus without pain or loose BM's, N/V, fever/chills, or any other complaints.  Review of Systems:  Constitutional: denies any other weight loss, fever, chills, or sweats  Eyes: denies any other vision changes, history of eye injury  ENT: denies sore throat, hearing problems  Respiratory: denies shortness of breath, wheezing  Cardiovascular: denies chest pain, palpitations  Gastrointestinal: abdominal pain, N/V, and bowel function as per HPI Musculoskeletal: denies any other joint pains or cramps  Skin: Denies any other rashes or skin discolorations  Neurological: denies any other headache, dizziness, weakness  Psychiatric: denies any other depression, anxiety  All other review of systems: otherwise negative   Vital Signs:  BP 120/82   Pulse 61   Temp 98 F (36.7 C) (Oral)   Ht 4\' 11"  (1.499 m)   Wt 165 lb 9.6 oz (75.1 kg)   BMI 33.45 kg/m    Physical Exam:  Constitutional:  -- Normal overweight body habitus  -- Awake, alert, and oriented x3  Eyes:  -- Pupils equally round and reactive to light  -- No scleral icterus  Ear, nose, throat:  -- No jugular venous distension  -- No nasal drainage, bleeding Pulmonary:  -- No crackles  -- No dullness to percussion  Cardiovascular:  -- S1, S2 present  -- No pericardial rubs  Gastrointestinal:  -- Soft, nontender, nondistended, no guarding/rebound -- All of patient's laparoscopic incisions and former drain site are well-approximated without erythema or drainage -- No abdominal masses appreciated, pulsatile or otherwise  Musculoskeletal / Integumentary:  -- Wounds or skin discoloration: None  appreciated except post-surgical sites as above (see GI)  -- Extremities: B/L UE and LE FROM, hands and feet warm, no edema  Neurologic:  -- Motor function: intact and symmetric  -- Sensation: intact and symmetric   Assessment:  46 y.o. yo Female with a problem list including...  Patient Active Problem List   Diagnosis Date Noted  . Infection   . Biloma following surgery   . Bile leak, postoperative 08/20/2016  . Calculus of gallbladder with acute cholecystitis without obstruction 08/07/2016    presents to clinic for follow-up evaluation, doing well s/p removal of subhepatic drain for duct of Luschka-associated biloma s/p otherwise uncomplicated laparoscopic cholecystectomy for acute calculous cholecystitis.  Plan:   - follow-up with Dr. Servando SnareWohl as scheduled re: removal of biliary stent   - okay to shower and submerge incisions under water (baths, swimming)  - okay to continue and resume regular diet and all activities without restrictions  - apply sunblock at least to incisions when exposed to sun to reduce hyperpigmentation of scars  - return to clinic as needed, instructed to call office if any questions or concerns  All of the above recommendations were discussed with the patient and patient's English-speaking and Spanish-speaking daughter, and all of patient's and her daughter's questions were answered to their expressed satisfaction.  -- Scherrie GerlachJason E. Earlene Plateravis, MD, RPVI [place]: [facility] General Surgery - Partnering for exceptional care. Office: [phone]

## 2016-10-19 ENCOUNTER — Ambulatory Visit: Payer: Self-pay

## 2016-10-19 ENCOUNTER — Encounter
Admission: RE | Disposition: A | Discharge status: Home / Self Care | Payer: Self-pay | Source: Ambulatory Visit | Attending: Gastroenterology

## 2016-10-19 ENCOUNTER — Ambulatory Visit: Payer: Self-pay | Admitting: Registered Nurse

## 2016-10-19 ENCOUNTER — Ambulatory Visit
Admission: RE | Admit: 2016-10-19 | Discharge: 2016-10-19 | Disposition: A | Discharge status: Home / Self Care | Payer: Self-pay | Source: Ambulatory Visit | Attending: Gastroenterology | Admitting: Gastroenterology

## 2016-10-19 DIAGNOSIS — Z8719 Personal history of other diseases of the digestive system: Secondary | ICD-10-CM | POA: Insufficient documentation

## 2016-10-19 DIAGNOSIS — Z4659 Encounter for fitting and adjustment of other gastrointestinal appliance and device: Secondary | ICD-10-CM

## 2016-10-19 DIAGNOSIS — Z9049 Acquired absence of other specified parts of digestive tract: Secondary | ICD-10-CM | POA: Insufficient documentation

## 2016-10-19 DIAGNOSIS — IMO0002 Reserved for concepts with insufficient information to code with codable children: Secondary | ICD-10-CM

## 2016-10-19 DIAGNOSIS — K9189 Other postprocedural complications and disorders of digestive system: Secondary | ICD-10-CM

## 2016-10-19 HISTORY — PX: ERCP: SHX5425

## 2016-10-19 SURGERY — ERCP, WITH INTERVENTION IF INDICATED
Anesthesia: General

## 2016-10-19 MED ORDER — PROMETHAZINE HCL 25 MG/ML IJ SOLN: 6.2500 mg | INTRAMUSCULAR | Status: DC | PRN

## 2016-10-19 MED ORDER — ONDANSETRON HCL 4 MG/2ML IJ SOLN
INTRAMUSCULAR | Status: AC
  Filled 2016-10-19: qty 2

## 2016-10-19 MED ORDER — SODIUM CHLORIDE 0.9 % IV SOLN
INTRAVENOUS | Status: DC
  Administered 2016-10-19: 11:00:00 via INTRAVENOUS

## 2016-10-19 MED ORDER — LIDOCAINE HCL (CARDIAC) 20 MG/ML IV SOLN
INTRAVENOUS | Status: DC | PRN
  Administered 2016-10-19: 60 mg via INTRAVENOUS

## 2016-10-19 MED ORDER — DEXAMETHASONE SODIUM PHOSPHATE 10 MG/ML IJ SOLN
INTRAMUSCULAR | Status: AC
  Filled 2016-10-19: qty 1

## 2016-10-19 MED ORDER — ONDANSETRON HCL 4 MG/2ML IJ SOLN
INTRAMUSCULAR | Status: DC | PRN
  Administered 2016-10-19: 4 mg via INTRAVENOUS

## 2016-10-19 MED ORDER — LIDOCAINE HCL (PF) 2 % IJ SOLN
INTRAMUSCULAR | Status: AC
  Filled 2016-10-19: qty 2

## 2016-10-19 MED ORDER — SUCCINYLCHOLINE CHLORIDE 20 MG/ML IJ SOLN
INTRAMUSCULAR | Status: DC | PRN
  Administered 2016-10-19: 80 mg via INTRAVENOUS

## 2016-10-19 MED ORDER — PROPOFOL 10 MG/ML IV BOLUS
INTRAVENOUS | Status: AC
  Filled 2016-10-19: qty 20

## 2016-10-19 MED ORDER — FENTANYL CITRATE (PF) 100 MCG/2ML IJ SOLN
INTRAMUSCULAR | Status: AC
  Filled 2016-10-19: qty 2

## 2016-10-19 MED ORDER — PROPOFOL 10 MG/ML IV BOLUS
INTRAVENOUS | Status: DC | PRN
  Administered 2016-10-19: 140 mg via INTRAVENOUS

## 2016-10-19 MED ORDER — DEXAMETHASONE SODIUM PHOSPHATE 10 MG/ML IJ SOLN
INTRAMUSCULAR | Status: DC | PRN
  Administered 2016-10-19: 10 mg via INTRAVENOUS

## 2016-10-19 MED ORDER — FENTANYL CITRATE (PF) 100 MCG/2ML IJ SOLN: 25.0000 ug | INTRAMUSCULAR | Status: DC | PRN

## 2016-10-19 MED ORDER — GLYCOPYRROLATE 0.2 MG/ML IJ SOLN
INTRAMUSCULAR | Status: DC | PRN
  Administered 2016-10-19: 0.2 mg via INTRAVENOUS

## 2016-10-19 MED ORDER — MIDAZOLAM HCL 2 MG/2ML IJ SOLN
INTRAMUSCULAR | Status: DC | PRN
Start: 2016-10-19 — End: 2016-10-19
  Administered 2016-10-19: 2 mg via INTRAVENOUS

## 2016-10-19 MED ORDER — MIDAZOLAM HCL 2 MG/2ML IJ SOLN
INTRAMUSCULAR | Status: AC
  Filled 2016-10-19: qty 2

## 2016-10-19 MED ORDER — FENTANYL CITRATE (PF) 100 MCG/2ML IJ SOLN
INTRAMUSCULAR | Status: DC | PRN
  Administered 2016-10-19: 50 ug via INTRAVENOUS

## 2016-10-19 NOTE — H&P (Signed)
   Midge Miniumarren Teja Costen, MD [facility] [address] [place], [place] [address] Phone:[phone] Fax : [phone]  Primary Care Physician:  [facility] Primary Gastroenterologist:  Dr. Servando SnareWohl  Pre-Procedure History & Physical: HPI:  Catherine Kennedy is a 46 y.o. female is here for an ERCP.   Past Medical History:  Diagnosis Date  . Acute cholecystitis 08/08/2016    Past Surgical History:  Procedure Laterality Date  . CHOLECYSTECTOMY N/A 08/08/2016   Procedure: LAPAROSCOPIC CHOLECYSTECTOMY;  Surgeon: Ancil Linseyavis, Jason Evan, MD;  Location: [facility] ORS;  Service: General;  Laterality: N/A;  . ENDOSCOPIC RETROGRADE CHOLANGIOPANCREATOGRAPHY (ERCP) WITH PROPOFOL N/A 08/21/2016   Procedure: ENDOSCOPIC RETROGRADE CHOLANGIOPANCREATOGRAPHY (ERCP) WITH PROPOFOL;  Surgeon: Midge MiniumWohl, Letcher Schweikert, MD;  Location: [facility] ENDOSCOPY;  Service: Endoscopy;  Laterality: N/A;  . FINGER SURGERY      Prior to Admission medications   Not on File    Allergies as of 08/25/2016  . (No Known Allergies)    Family History  Problem Relation Age of Onset  . Diabetes Father     Social History   Social History  . Marital status: Single    Spouse name: N/A  . Number of children: N/A  . Years of education: N/A   Occupational History  . Not on file.   Social History Main Topics  . Smoking status: Never Smoker  . Smokeless tobacco: Never Used  . Alcohol use No  . Drug use: No  . Sexual activity: Not on file   Other Topics Concern  . Not on file   Social History Narrative  . No narrative on file    Review of Systems: See HPI, otherwise negative ROS  Physical Exam: BP 135/68   Pulse 66   Temp 98.2 F (36.8 C) (Tympanic)   Resp 18   Ht 4\' 11"  (1.499 m)   Wt 163 lb (73.9 kg)   SpO2 100%   BMI 32.92 kg/m  General:   Alert,  pleasant and cooperative in NAD Head:  Normocephalic and atraumatic. Neck:  Supple; no masses or thyromegaly. Lungs:  Clear throughout to auscultation.      Heart:  Regular rate and rhythm. Abdomen:  Soft, nontender and nondistended. Normal bowel sounds, without guarding, and without rebound.   Neurologic:  Alert and  oriented x4;  grossly normal neurologically.  Impression/Plan: Catherine Kennedy is here for an ERCP to be performed for stent removal  Risks, benefits, limitations, and alternatives regarding  ERCP have been reviewed with the patient.  Questions have been answered.  All parties agreeable.   Midge Miniumarren Trinh Sanjose, MD  10/19/2016, 11:18 AM

## 2016-10-19 NOTE — Anesthesia Post-op Follow-up Note (Cosign Needed)
Anesthesia QCDR form completed.        

## 2016-10-19 NOTE — Anesthesia Procedure Notes (Signed)
Procedure Name: Intubation Date/Time: 10/19/2016 11:29 AM Performed by: Doreen Salvage Pre-anesthesia Checklist: Patient identified, Patient being monitored, Timeout performed, Emergency Drugs available and Suction available Patient Re-evaluated:Patient Re-evaluated prior to induction Oxygen Delivery Method: Circle system utilized Preoxygenation: Pre-oxygenation with 100% oxygen Induction Type: IV induction Ventilation: Mask ventilation without difficulty Laryngoscope Size: Mac and 3 Grade View: Grade I Tube type: Oral Tube size: 7.0 mm Number of attempts: 1 Airway Equipment and Method: Stylet Placement Confirmation: ETT inserted through vocal cords under direct vision,  positive ETCO2 and breath sounds checked- equal and bilateral Secured at: 21 cm Tube secured with: Tape Dental Injury: Teeth and Oropharynx as per pre-operative assessment

## 2016-10-19 NOTE — Anesthesia Preprocedure Evaluation (Signed)
Anesthesia Evaluation  Patient identified by MRN, date of birth, ID band Patient awake    Reviewed: Allergy & Precautions, NPO status , Patient's Chart, lab work & pertinent test results  History of Anesthesia Complications Negative for: history of anesthetic complications  Airway Mallampati: III  TM Distance: >3 FB     Dental  (+) Chipped   Pulmonary neg pulmonary ROS,    Pulmonary exam normal        Cardiovascular Exercise Tolerance: Good negative cardio ROS Normal cardiovascular exam     Neuro/Psych negative neurological ROS  negative psych ROS   GI/Hepatic Neg liver ROS, Acute gallbladder   Endo/Other  negative endocrine ROS  Renal/GU negative Renal ROS  negative genitourinary   Musculoskeletal negative musculoskeletal ROS (+)   Abdominal Normal abdominal exam  (+) - obese,   Peds negative pediatric ROS (+)  Hematology negative hematology ROS (+)   Anesthesia Other Findings Past Medical History: 08/08/2016: Acute cholecystitis   Reproductive/Obstetrics negative OB ROS                             Anesthesia Physical  Anesthesia Plan  ASA: II and emergent  Anesthesia Plan: General   Post-op Pain Management:    Induction: Intravenous and Rapid sequence  PONV Risk Score and Plan:   Airway Management Planned: Oral ETT  Additional Equipment:   Intra-op Plan:   Post-operative Plan: Extubation in OR  Informed Consent: I have reviewed the patients History and Physical, chart, labs and discussed the procedure including the risks, benefits and alternatives for the proposed anesthesia with the patient or authorized representative who has indicated his/her understanding and acceptance.   Dental advisory given  Plan Discussed with: CRNA and Surgeon  Anesthesia Plan Comments:         Anesthesia Quick Evaluation

## 2016-10-19 NOTE — Transfer of Care (Signed)
Immediate Anesthesia Transfer of Care Note  Patient: Catherine Kennedy  Procedure(s) Performed: Procedure(s): ENDOSCOPIC RETROGRADE CHOLANGIOPANCREATOGRAPHY (ERCP) (N/A)  Patient Location: PACU  Anesthesia Type:General  Level of Consciousness: sedated  Airway & Oxygen Therapy: Patient Spontanous Breathing and Patient connected to face mask oxygen  Post-op Assessment: Report given to RN and Post -op Vital signs reviewed and stable  Post vital signs: Reviewed and stable  Last Vitals:  Vitals:   10/19/16 1034 10/19/16 1204  BP: 135/68 115/73  Pulse: 66 89  Resp: 18 12  Temp: 36.8 C 36.5 C    Complications: No apparent anesthesia complications

## 2016-10-19 NOTE — Op Note (Signed)
[facility] Gastroenterology Patient Name: Catherine Kennedy Procedure Date: 10/19/2016 10:56 AM MRN: [id number] Account #: [id number] Date of Birth: December 11, 1970 Admit Type: Outpatient Age: [age] [facility] ROOM 4 Gender: Female Note Status: Finalized Procedure:            ERCP Indications:          Biliary stent removal Providers:            Midge Miniumarren Azazel Franze MD, MD Referring MD:         No Local Md, MD (Referring MD) Medicines:            General Anesthesia Complications:        No immediate complications. Procedure:            Pre-Anesthesia Assessment:                       - Prior to the procedure, a History and Physical was                        performed, and patient medications and allergies were                        reviewed. The patient's tolerance of previous                        anesthesia was also reviewed. The risks and benefits of                        the procedure and the sedation options and risks were                        discussed with the patient. All questions were                        answered, and informed consent was obtained. Prior                        Anticoagulants: The patient has taken no previous                        anticoagulant or antiplatelet agents. ASA Grade                        Assessment: II - A patient with mild systemic disease.                        After reviewing the risks and benefits, the patient was                        deemed in satisfactory condition to undergo the                        procedure.                       After obtaining informed consent, the scope was passed                        under direct vision. Throughout the procedure, the  patient's blood pressure, pulse, and oxygen saturations                        were monitored continuously. The Endoscope was                        introduced through the mouth, and used to inject                        contrast into  and used to inject contrast into the bile                        duct. The ERCP was accomplished without difficulty. The                        patient tolerated the procedure well. Findings:      A biliary stent was visible on the scout film. One stent was removed       from the biliary tree using a snare. A wire was passed into the biliary       tree. The bile duct was deeply cannulated. Contrast was injected. I       personally interpreted the bile duct images. There was brisk flow of       contrast through the ducts. Image quality was excellent. Contrast       extended to the entire biliary tree. Impression:           - One stent was removed from the biliary tree.                       - No further leak seen. Recommendation:       - Discharge patient to home.                       - Resume previous diet. Procedure Code(s):    --- Professional ---                       [phone], Endoscopic retrograde cholangiopancreatography                        (ERCP); with removal of foreign body(s) or stent(s)                        from biliary/pancreatic duct(s)                       [phone], Endoscopic catheterization of the biliary ductal                        system, radiological supervision and interpretation Diagnosis Code(s):    --- Professional ---                       U98.11, Encounter for fitting and adjustment of other                        gastrointestinal appliance and device CPT copyright 2016 [employer]. All rights reserved. The codes documented in this report are preliminary and upon coder review may  be revised to meet current compliance requirements. Midge Minium MD, MD 10/19/2016 11:55:40 AM This report has been signed  electronically. Number of Addenda: 0 Note Initiated On: 10/19/2016 10:56 AM      [facility]

## 2016-10-20 ENCOUNTER — Encounter: Payer: Self-pay | Admitting: Gastroenterology

## 2016-10-20 NOTE — Anesthesia Postprocedure Evaluation (Signed)
Anesthesia Post Note  Patient: Catherine Kennedy  Procedure(s) Performed: Procedure(s) (LRB): ENDOSCOPIC RETROGRADE CHOLANGIOPANCREATOGRAPHY (ERCP) (N/A)  Patient location during evaluation: PACU Anesthesia Type: General Level of consciousness: awake and alert Pain management: pain level controlled Vital Signs Assessment: post-procedure vital signs reviewed and stable Respiratory status: spontaneous breathing, nonlabored ventilation, respiratory function stable and patient connected to nasal cannula oxygen Cardiovascular status: blood pressure returned to baseline and stable Postop Assessment: no signs of nausea or vomiting Anesthetic complications: no     Last Vitals:  Vitals:   10/19/16 1234 10/19/16 1246  BP: 120/74 124/78  Pulse: 69 65  Resp: (!) 25 (!) 22  Temp:  (!) 36.3 C    Last Pain:  Vitals:   10/20/16 0742  TempSrc:   PainSc: 0-No pain                 Lenard SimmerAndrew Raydan Schlabach

## 2017-01-18 LAB — HIV ANTIBODY (ROUTINE TESTING W REFLEX): HIV SCREEN 4TH GENERATION: NONREACTIVE

## 2018-09-07 IMAGING — US US ABDOMEN LIMITED
1 series · 14 of 25 positions shown · non-contrast
Comparison: None.

CLINICAL DATA: 45-year-old female with right upper quadrant
abdominal pain for 1 day. Initial encounter.

EXAM:
US ABDOMEN LIMITED - RIGHT UPPER QUADRANT

[Series 1: us abdomen limited · 0.20mm/px · 14 of 62 slices shown]
[im 1/62]
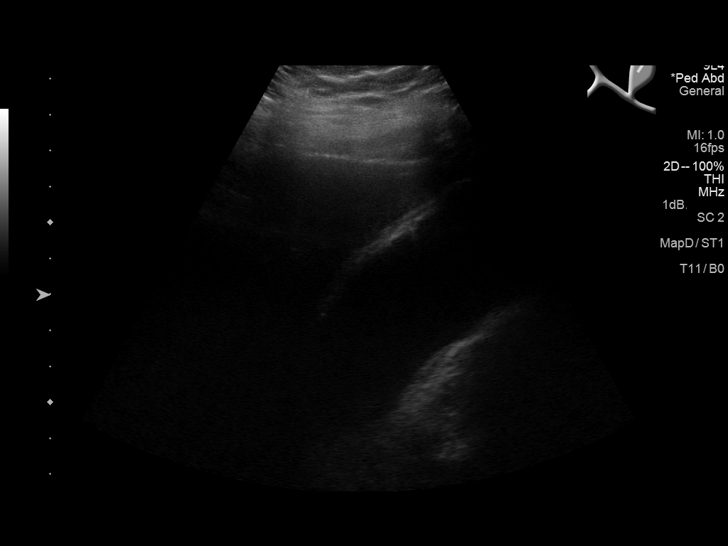
[im 6/62]
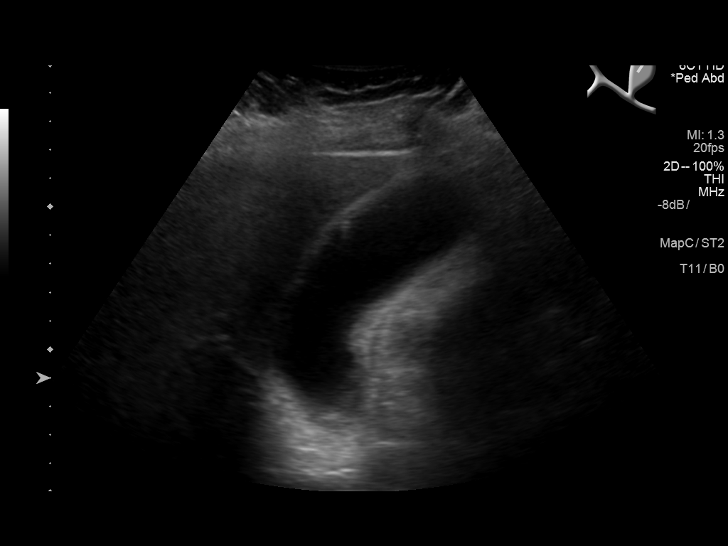
[im 11/62]
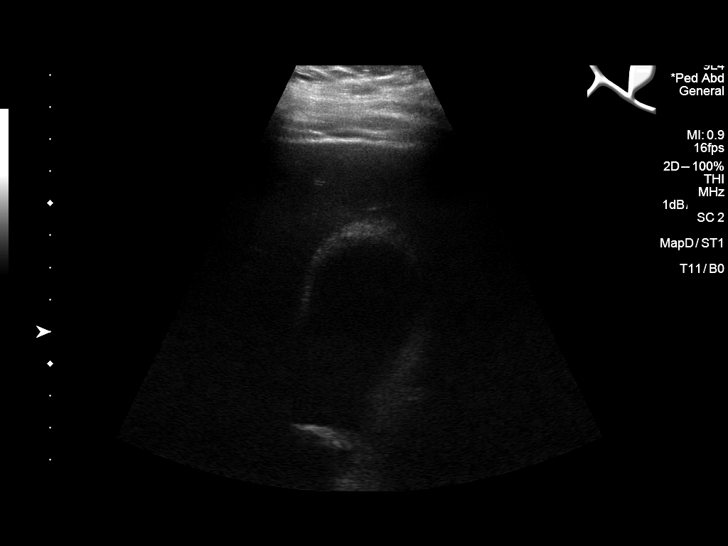
[im 16/62]
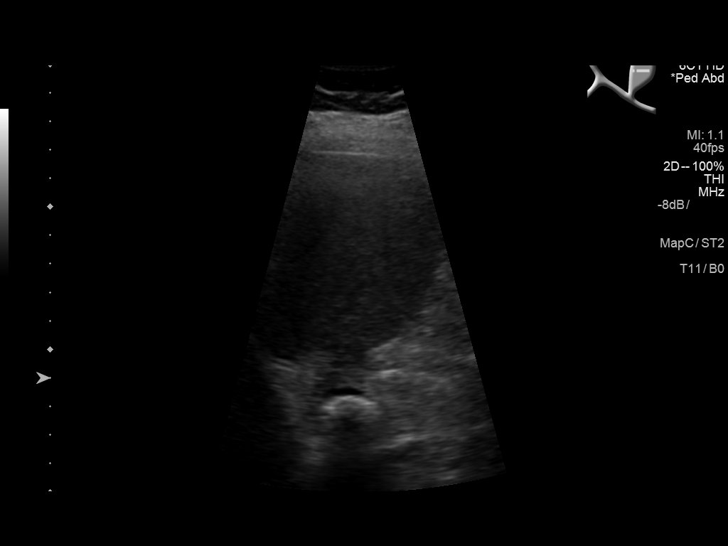
[im 21/62]
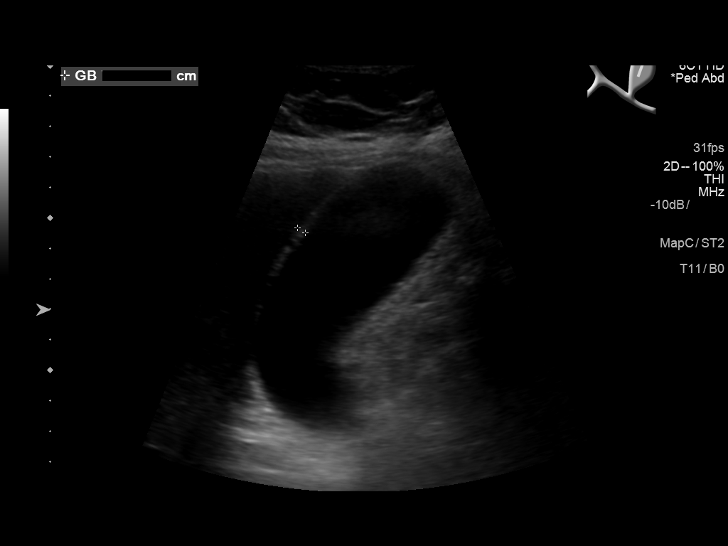
[im 23/62]
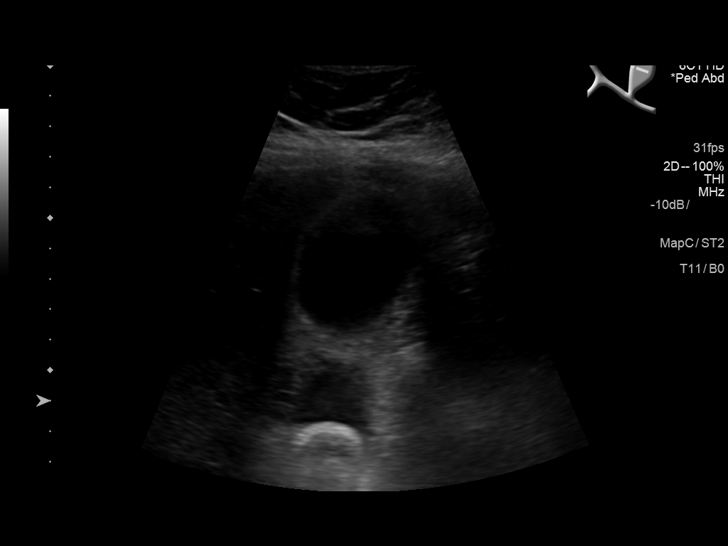
[im 28/62]
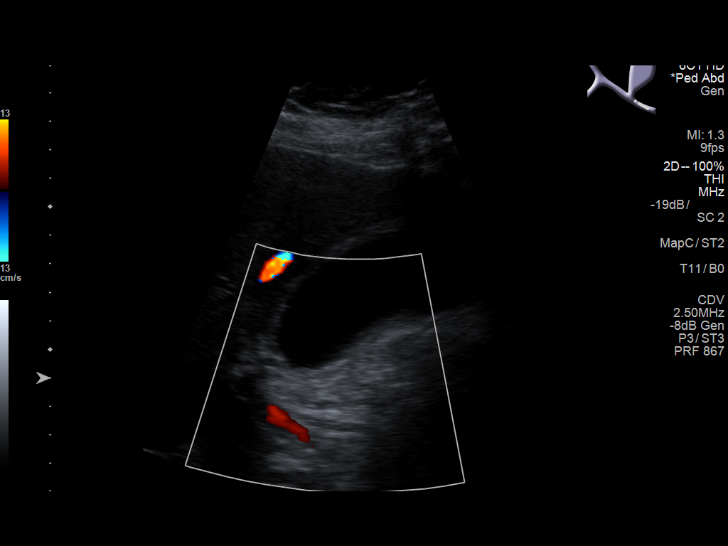
[im 34/62]
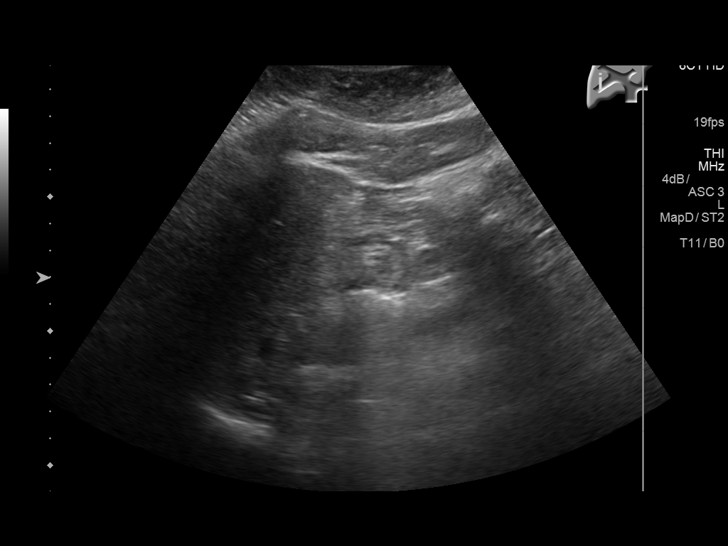
[im 39/62]
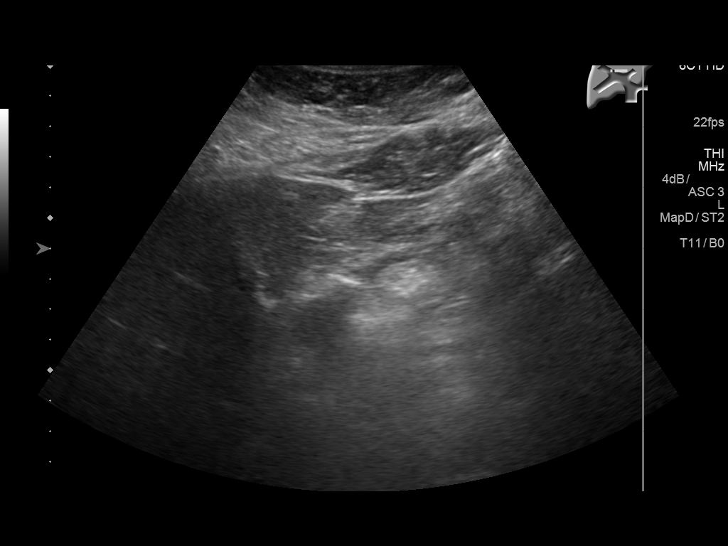
[im 41/62]
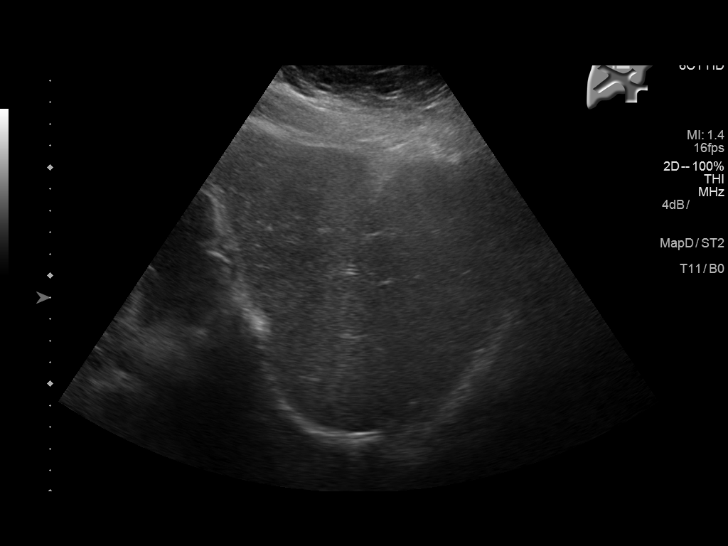
[im 46/62]
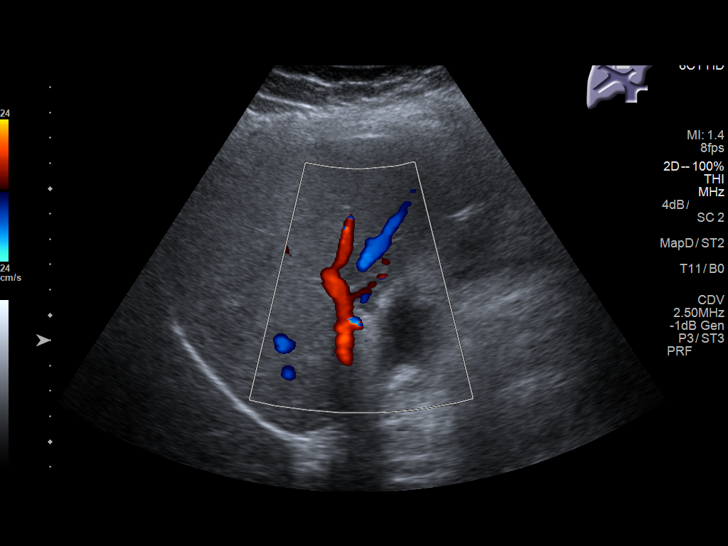
[im 51/62]
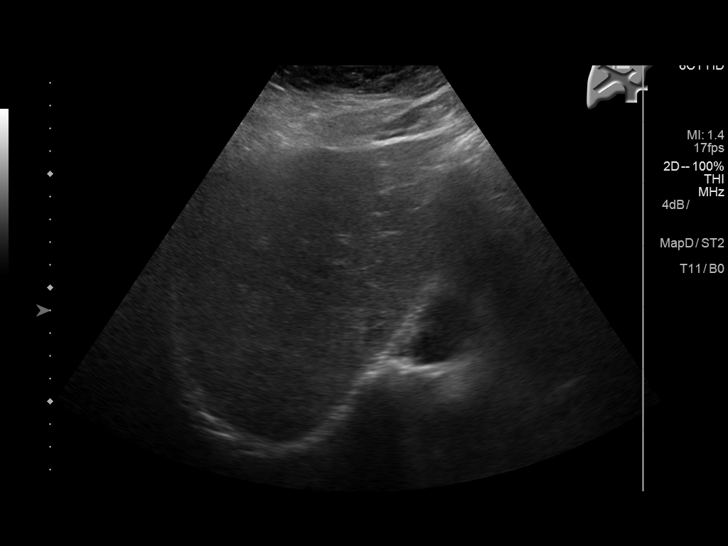
[im 56/62]
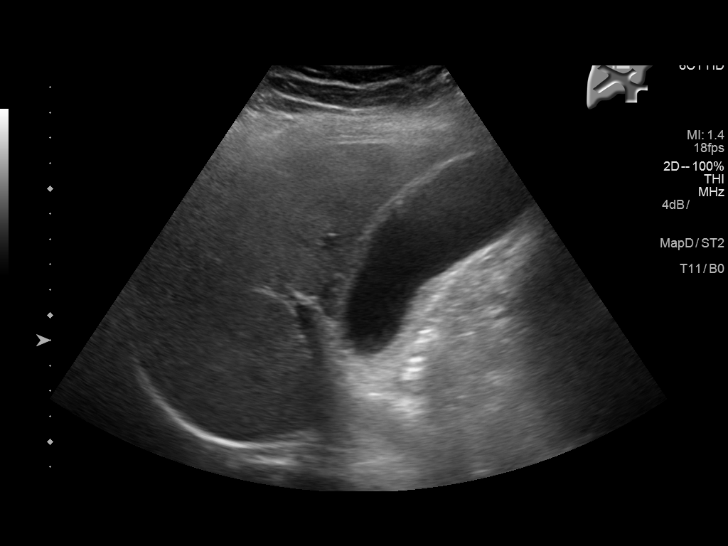
[im 62/62]
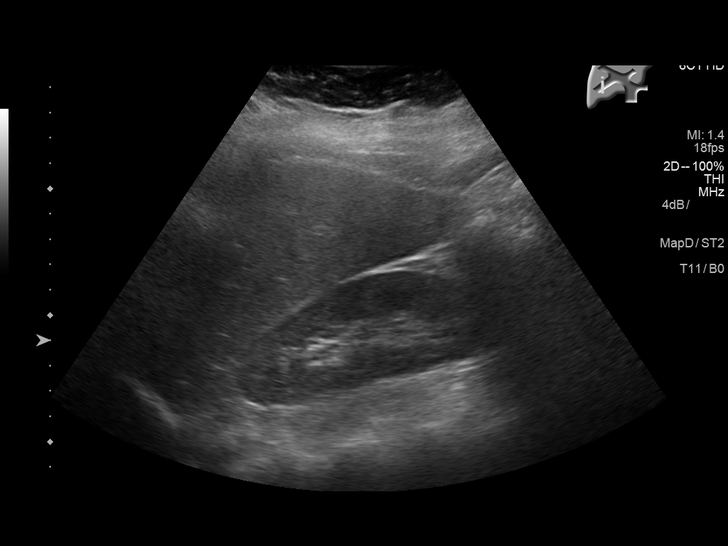

[14 of 25 positions shown; findings below may reference images not displayed]

FINDINGS: Gallbladder:

A 2.2 cm calculus at the gallbladder neck is noted. Gallbladder wall
thickening is present. No definite sonographic Murphy sign or
pericholecystic fluid noted.

Common bile duct:

Diameter: 4.3 mm. There is no evidence of intrahepatic or
extrahepatic biliary dilatation.

Liver:

No focal lesion identified. Within normal limits in parenchymal
echogenicity.
IMPRESSION: 2.2 cm calculus at the gallbladder neck with gallbladder wall
thickening- likely representing acute cholecystitis. No evidence of
biliary dilatation.

## 2018-09-19 IMAGING — US US ABDOMEN LIMITED
1 series · 14 of 25 positions shown · non-contrast
Comparison: Prior MRI from 08/14/2016.

CLINICAL DATA: Initial evaluation for acute abdominal pain, recent
cholecystectomy.

EXAM:
US ABDOMEN LIMITED - RIGHT UPPER QUADRANT

[Series 1: us abdomen limited · 0.25mm/px · 14 of 44 slices shown]
[im 1/44]
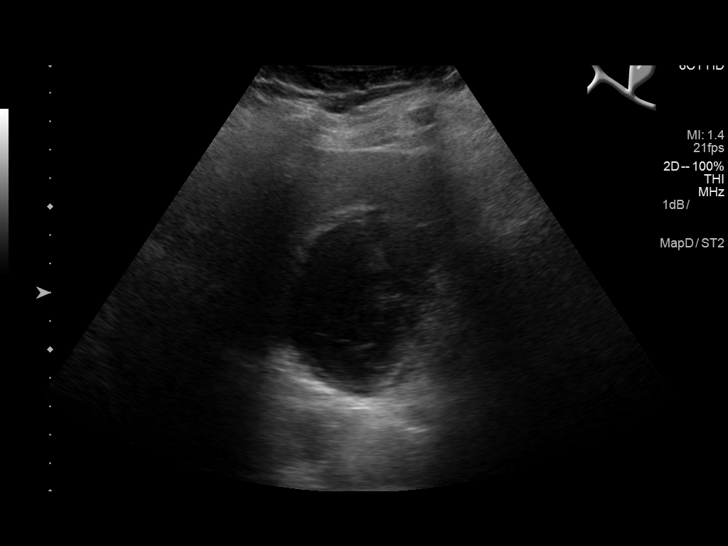
[im 4/44]
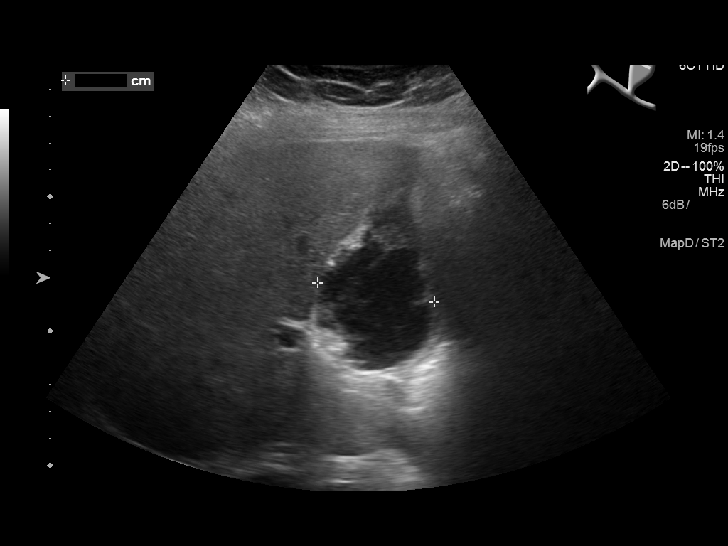
[im 8/44]
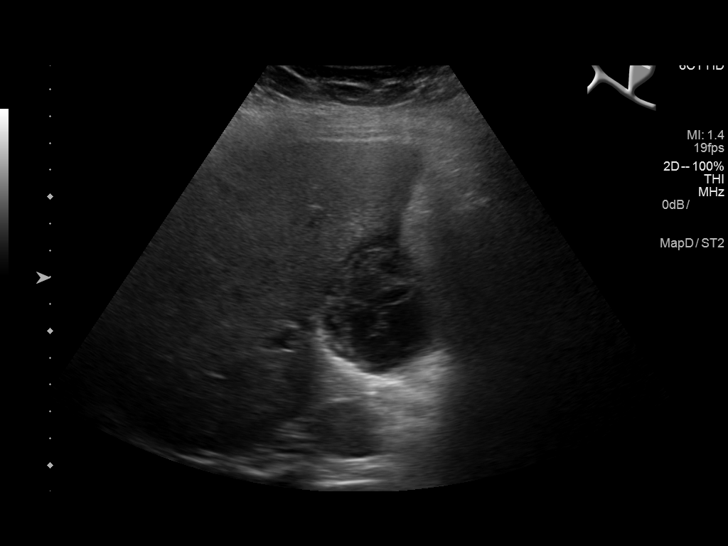
[im 11/44]
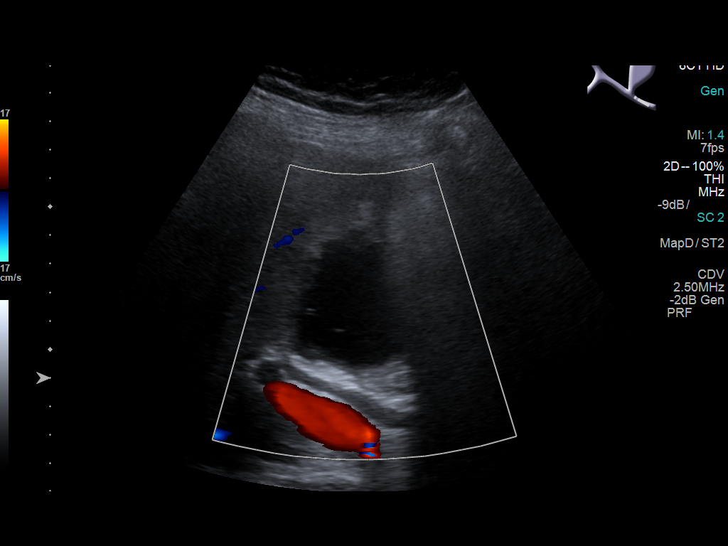
[im 15/44]
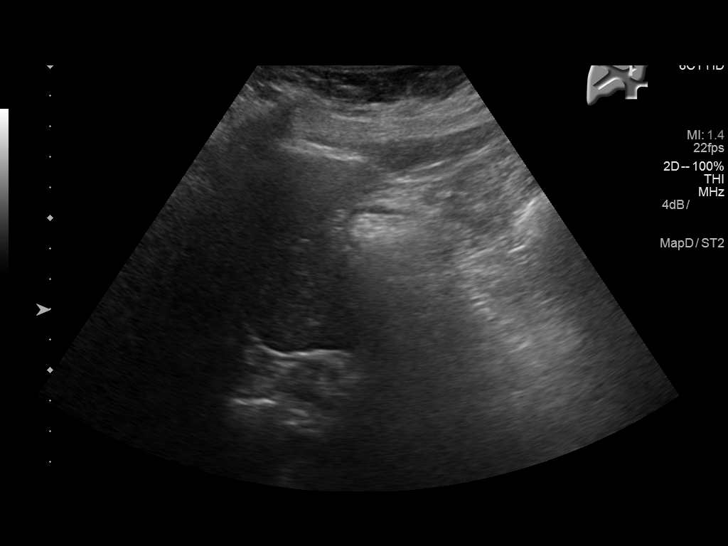
[im 17/44]
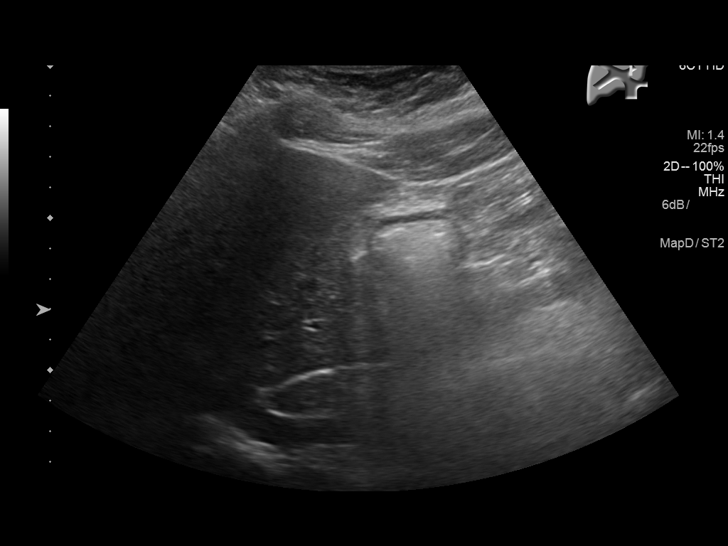
[im 20/44]
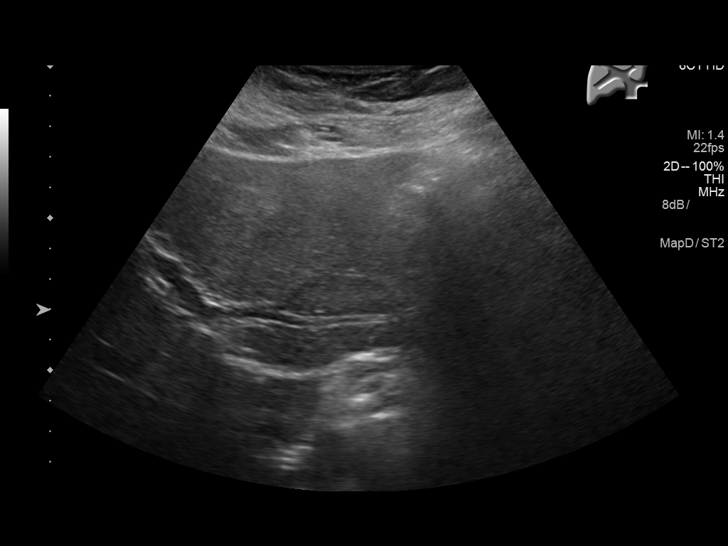
[im 24/44]
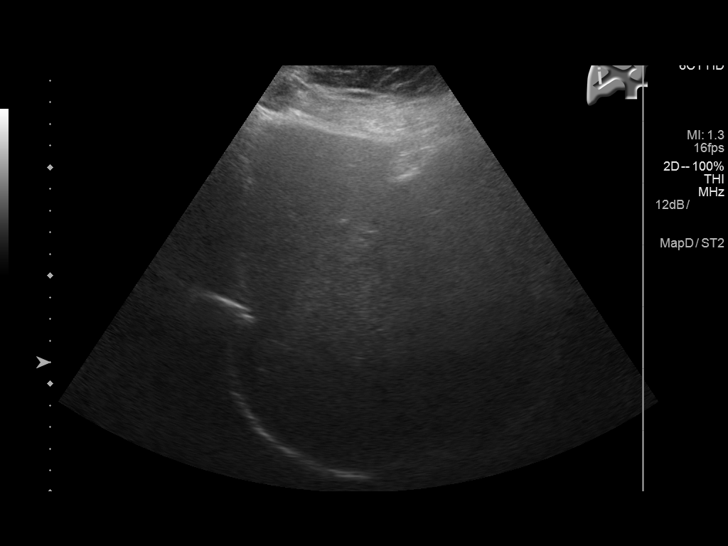
[im 27/44]
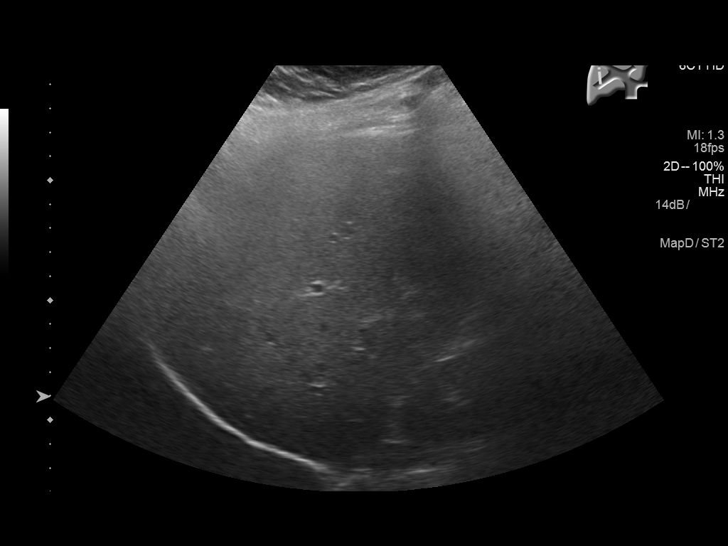
[im 29/44]
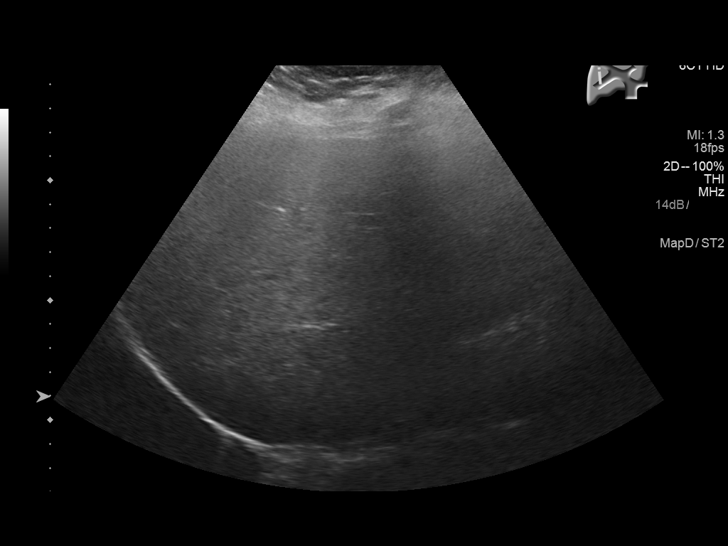
[im 33/44]
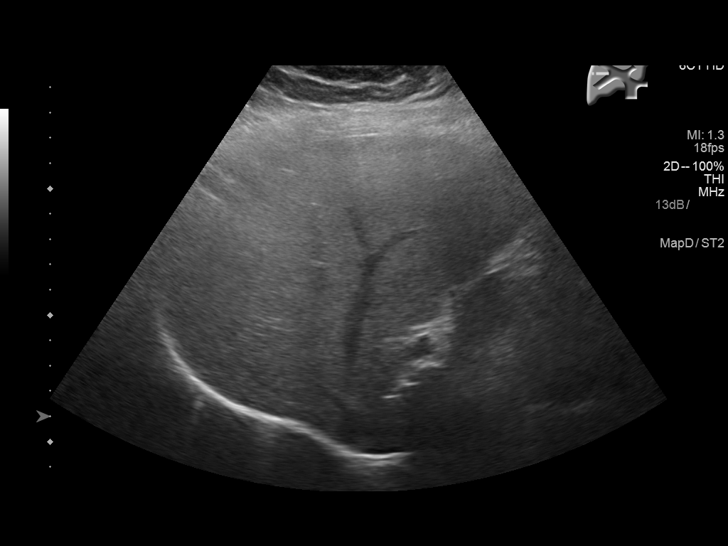
[im 36/44]
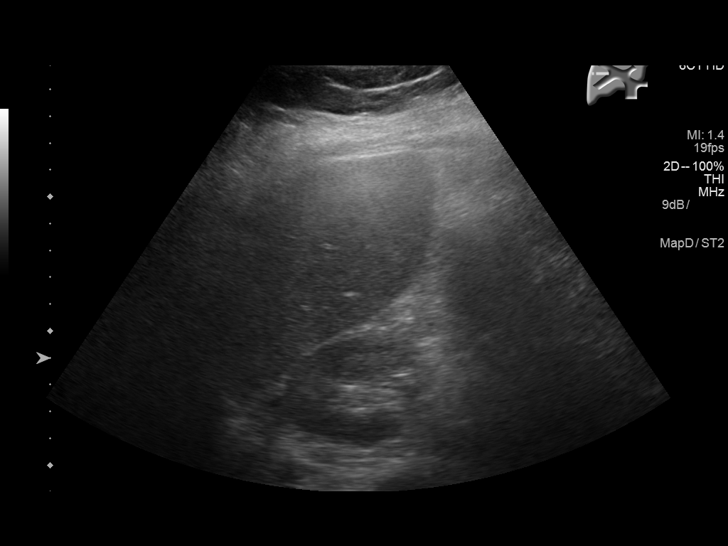
[im 40/44]
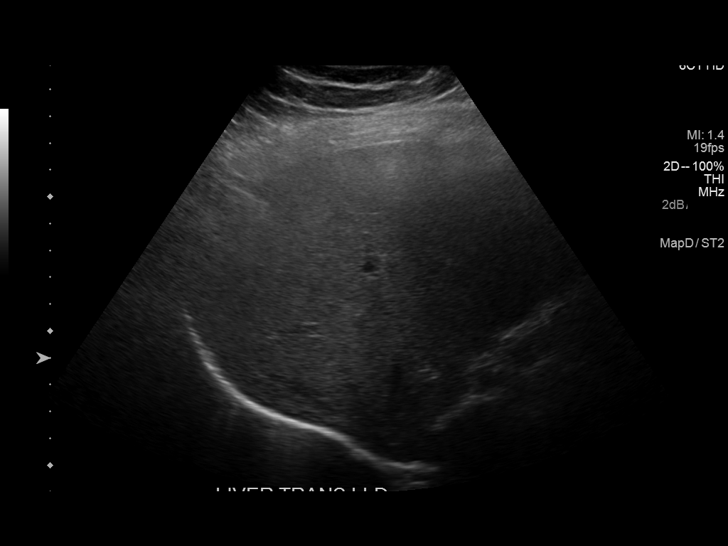
[im 44/44]
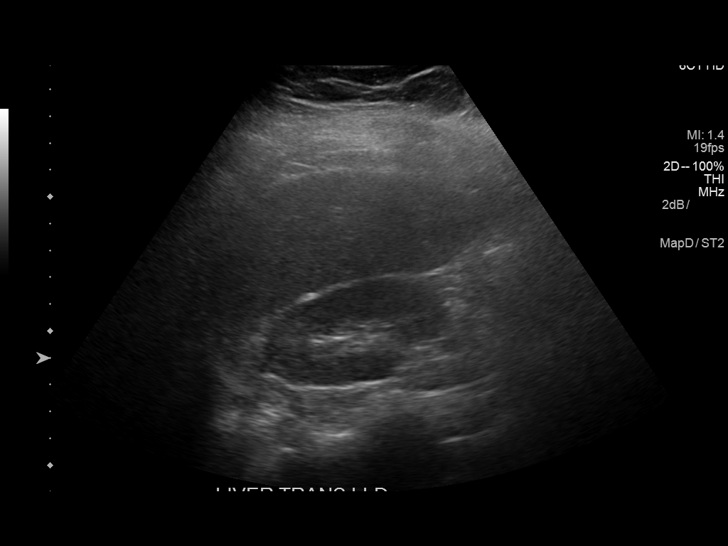

[14 of 25 positions shown; findings below may reference images not displayed]

FINDINGS: Gallbladder:

Surgically absent. 4.9 x 4.4 x 6.7 cm complex fluid collection
present within the gallbladder fossa. This is similar to previous.

Common bile duct:

Diameter: 7.6 mm

Liver:

No focal lesion identified. Within normal limits in parenchymal
echogenicity.
IMPRESSION: 4.9 x 4.4 x 6.7 cm complex collection. Again, finding may reflect
postoperative seroma or possibly biloma. This was present on recent
MRI from 08/14/2016.

## 2018-10-13 IMAGING — CT CT IMAGE GUIDED DRAINAGE BY PERCUTANEOUS CATHETER
1 of 2 series · 15 of 32 positions shown, 19 images · non-contrast
Comparison: none

INDICATION: Gallbladder fossa abscess
TECHNIQUE: Informed written consent was obtained from the patient after a
thorough discussion of the procedural risks, benefits and
alternatives. All questions were addressed. Maximal Sterile Barrier
Technique was utilized including caps, mask, sterile gowns, sterile
gloves, sterile drape, hand hygiene and skin antiseptic. A timeout
was performed prior to the initiation of the procedure.

[Series 2: i-spiral 5.0 b30f · axial · 0.83mm/px · z∈[+238,+445]mm · 15 of 65 slices shown, 19 images]
[im 3/65  soft-tissue]
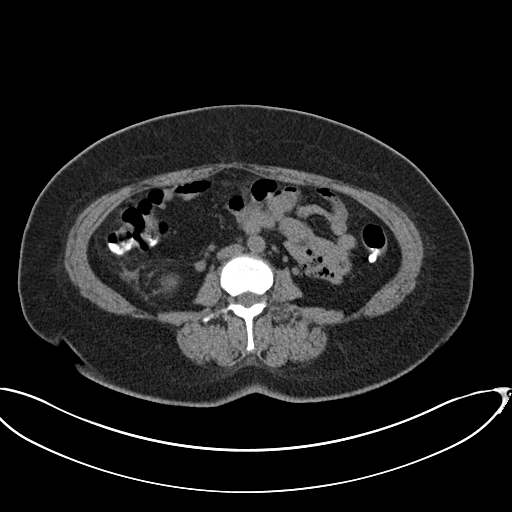
[im 3/65  bone]
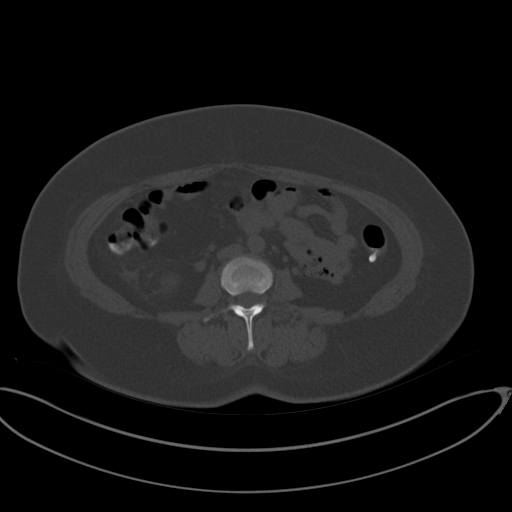
[im 9/65  soft-tissue]
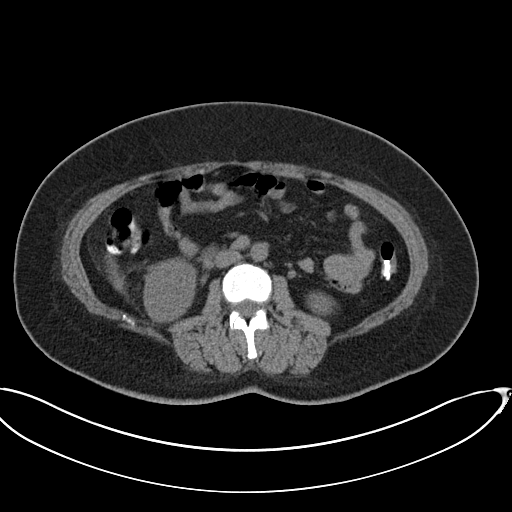
[im 14/65  soft-tissue]
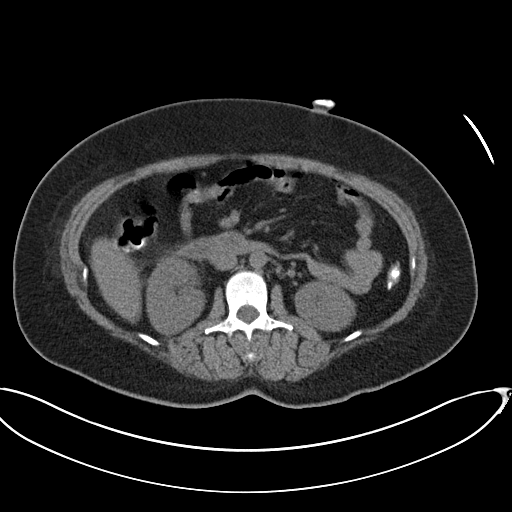
[im 19/65  soft-tissue]
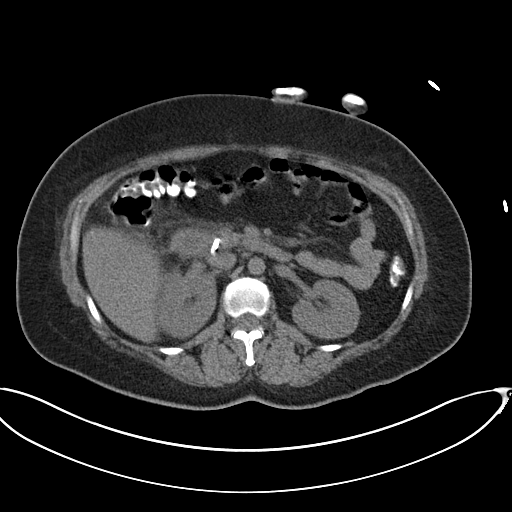
[im 22/65  soft-tissue]
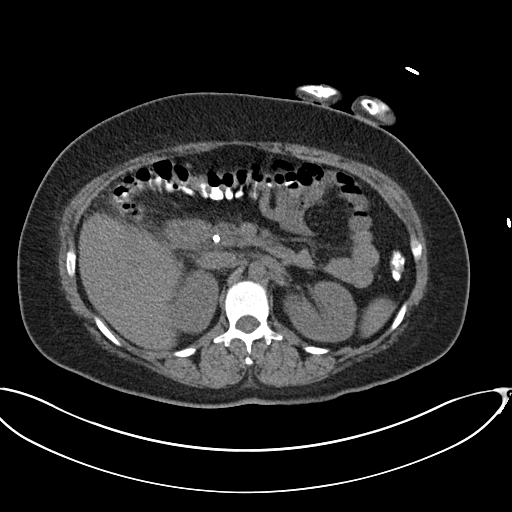
[im 27/65  soft-tissue]
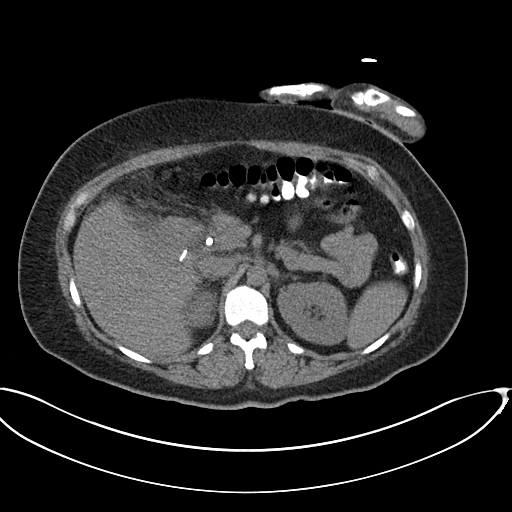
[im 33/65  soft-tissue]
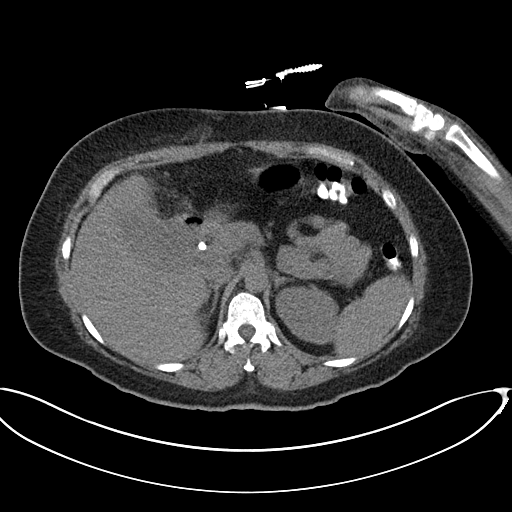
[im 38/65  soft-tissue]
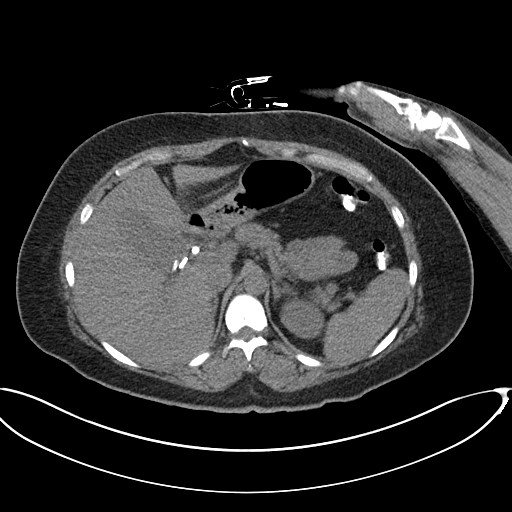
[im 43/65  soft-tissue]
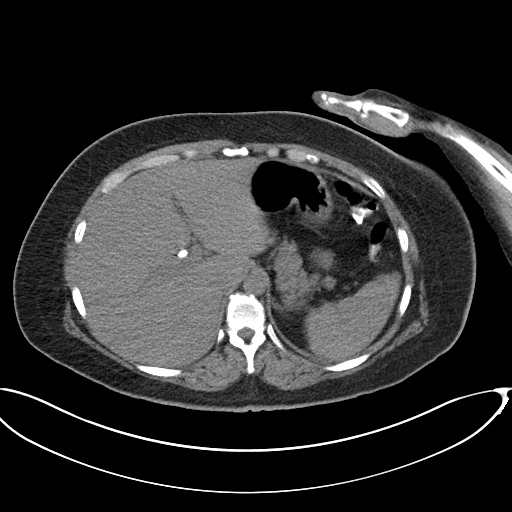
[im 43/65  bone]
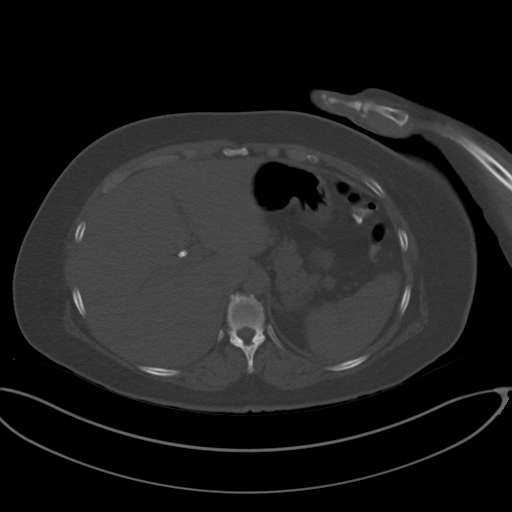
[im 46/65  soft-tissue]
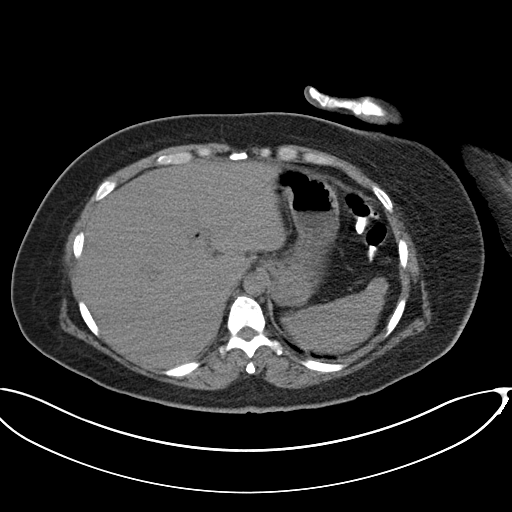
[im 51/65  soft-tissue]
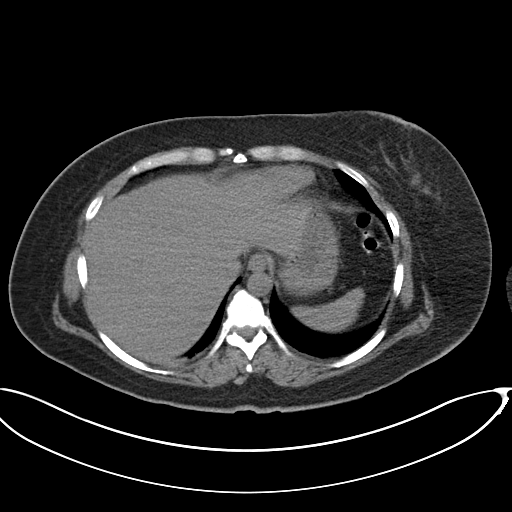
[im 54/65  lung]
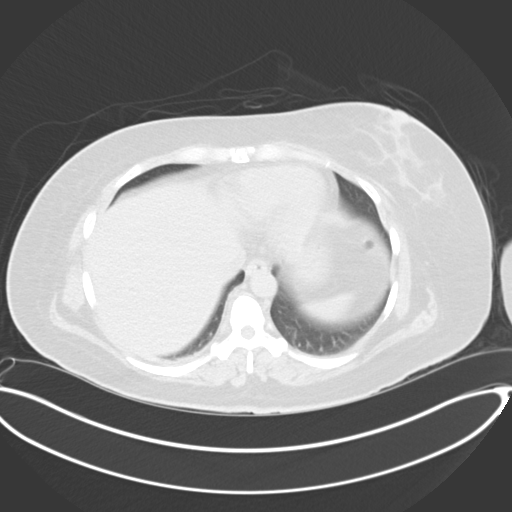
[im 57/65  soft-tissue]
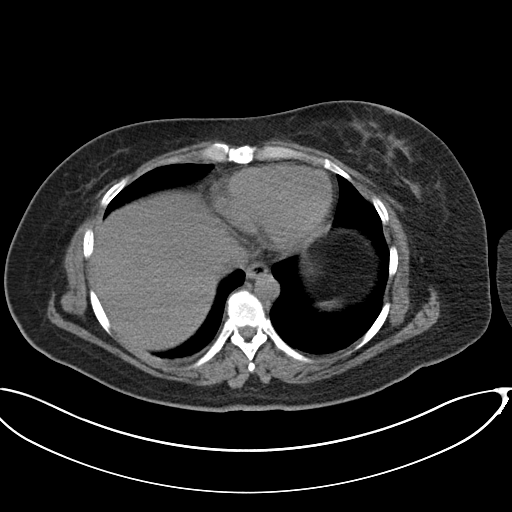
[im 57/65  lung]
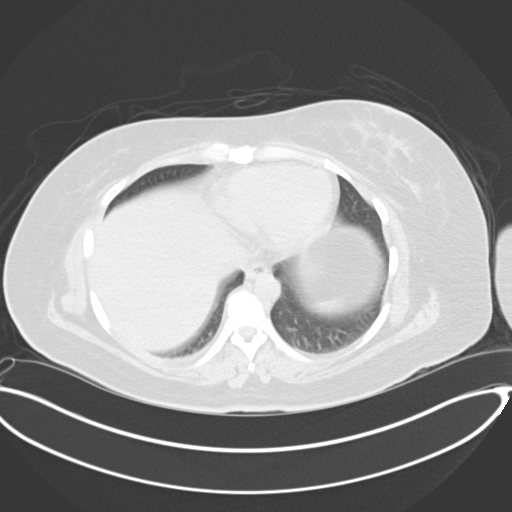
[im 59/65  lung]
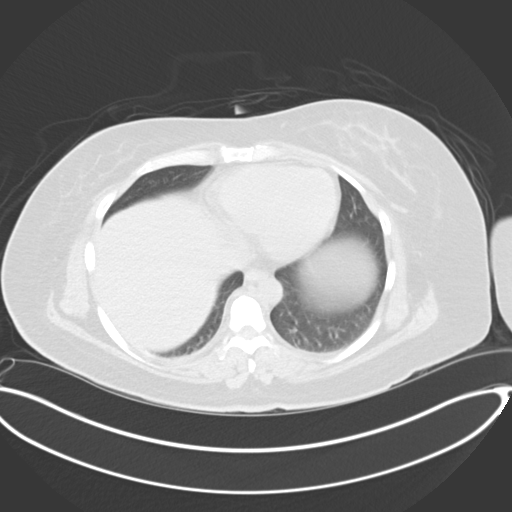
[im 62/65  soft-tissue]
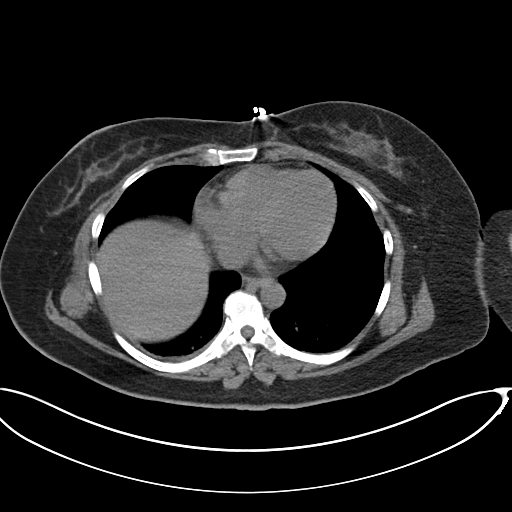
[im 62/65  lung]
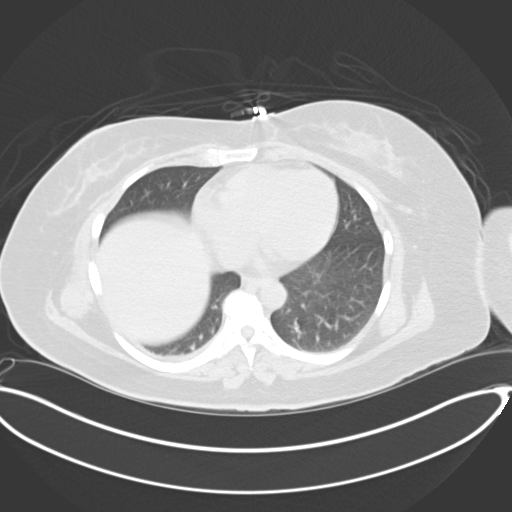

[15 of 32 positions shown; findings below may reference images not displayed]

EXAM:
CT GUIDED DRAINAGE OF GALLBLADDER FOSSA ABSCESS

MEDICATIONS:
The patient is currently admitted to the hospital and receiving
intravenous antibiotics. The antibiotics were administered within an
appropriate time frame prior to the initiation of the procedure.

ANESTHESIA/SEDATION:
Three mg IV Versed 150 mcg IV Fentanyl

Moderate Sedation Time:  16

The patient was continuously monitored during the procedure by the
interventional radiology nurse under my direct supervision.

COMPLICATIONS:
None immediate.
PROCEDURE:
The right upper quadrant was prepped with ChloraPrep in a sterile
fashion, and a sterile drape was applied covering the operative
field. A sterile gown and sterile gloves were used for the
procedure. Local anesthesia was provided with 1% Lidocaine. Under CT
guidance, an 18 gauge needle was inserted into the gallbladder fossa
and removed over an Amplatz wire. A 10 French dilator followed by a
10 French drain were inserted. It was looped and string fixed in the
fluid collection, then sewn to the skin. Frank pus was aspirated.
FINDINGS: Images document 10 French drain placement into a gallbladder fossa
abscess.
IMPRESSION: Successful gallbladder fossa abscess drain yielding pus.

## 2019-04-21 ENCOUNTER — Other Ambulatory Visit: Payer: Self-pay

## 2019-04-21 ENCOUNTER — Emergency Department
Admission: EM | Admit: 2019-04-21 | Discharge: 2019-04-21 | Disposition: A | Discharge status: Home / Self Care | Payer: Self-pay | Attending: Emergency Medicine | Admitting: Emergency Medicine

## 2019-04-21 DIAGNOSIS — L509 Urticaria, unspecified: Secondary | ICD-10-CM | POA: Insufficient documentation

## 2019-04-21 MED ORDER — DIPHENHYDRAMINE HCL 25 MG PO TABS: 25.0000 mg | ORAL_TABLET | Freq: Four times a day (QID) | ORAL | 0 refills | Status: AC | PRN

## 2019-04-21 MED ORDER — PREDNISONE 50 MG PO TABS: ORAL_TABLET | ORAL | 0 refills | Status: AC

## 2019-04-21 MED ORDER — PREDNISONE 20 MG PO TABS
60.0000 mg | ORAL_TABLET | Freq: Once | ORAL | Status: AC
  Administered 2019-04-21: 60 mg via ORAL
  Filled 2019-04-21: qty 3

## 2019-04-21 MED ORDER — FAMOTIDINE 20 MG PO TABS: 20.0000 mg | ORAL_TABLET | Freq: Two times a day (BID) | ORAL | 1 refills | Status: AC

## 2019-04-21 MED ORDER — DIPHENHYDRAMINE HCL 25 MG PO CAPS
25.0000 mg | ORAL_CAPSULE | Freq: Once | ORAL | Status: AC
  Administered 2019-04-21: 17:00:00 25 mg via ORAL
  Filled 2019-04-21: qty 1

## 2019-04-21 MED ORDER — FAMOTIDINE 20 MG PO TABS
40.0000 mg | ORAL_TABLET | Freq: Once | ORAL | Status: AC
  Administered 2019-04-21: 40 mg via ORAL
  Filled 2019-04-21: qty 2

## 2019-04-21 NOTE — ED Triage Notes (Signed)
Pt c/o itchy rash all over since yesterday, states she took a weight loss pill yesterday and thinks it is related to that. Denies any trouble breathing or swallowing.

## 2019-04-21 NOTE — ED Provider Notes (Signed)
Emergency Department Provider Note  ____________________________________________  Time seen: Approximately 4:23 PM  I have reviewed the triage vital signs and the nursing notes.   HISTORY  Chief Complaint Rash   Historian Patient     HPI Catherine Kennedy is a 49 y.o. female  presents to the emergency department with urticaria of the chest and upper extremities that started yesterday after patient took a weight loss medication.  She denies shortness of breath, chest tightness, emesis or diarrhea.  She has not experienced similar episodes of urticaria.  No prior history of anaphylaxis.  No alleviating medications have been attempted at home.   Past Medical History:  Diagnosis Date  . Acute cholecystitis 08/08/2016     Immunizations up to date:  Yes.     Past Medical History:  Diagnosis Date  . Acute cholecystitis 08/08/2016    Patient Active Problem List   Diagnosis Date Noted  . Encounter for fitting and adjustment of other gastrointestinal appliance and device   . Infection   . Biloma following surgery   . Bile leak, postoperative 08/20/2016  . Calculus of gallbladder with acute cholecystitis without obstruction 08/07/2016    Past Surgical History:  Procedure Laterality Date  . CHOLECYSTECTOMY N/A 08/08/2016   Procedure: LAPAROSCOPIC CHOLECYSTECTOMY;  Surgeon: Vickie Epley, MD;  Location: [facility] ORS;  Service: General;  Laterality: N/A;  . ENDOSCOPIC RETROGRADE CHOLANGIOPANCREATOGRAPHY (ERCP) WITH PROPOFOL N/A 08/21/2016   Procedure: ENDOSCOPIC RETROGRADE CHOLANGIOPANCREATOGRAPHY (ERCP) WITH PROPOFOL;  Surgeon: Lucilla Lame, MD;  Location: [facility] ENDOSCOPY;  Service: Endoscopy;  Laterality: N/A;  . ERCP N/A 10/19/2016   Procedure: ENDOSCOPIC RETROGRADE CHOLANGIOPANCREATOGRAPHY (ERCP);  Surgeon: Lucilla Lame, MD;  Location: [facility] ENDOSCOPY;  Service: Endoscopy;  Laterality: N/A;  . FINGER SURGERY      Prior to Admission medications   Not on File     Allergies Patient has no known allergies.  Family History  Problem Relation Age of Onset  . Diabetes Father     Social History Social History   Tobacco Use  . Smoking status: Never Smoker  . Smokeless tobacco: Never Used  Substance Use Topics  . Alcohol use: No  . Drug use: No     Review of Systems  Constitutional: No fever/chills Eyes:  No discharge ENT: No upper respiratory complaints. Respiratory: no cough. No SOB/ use of accessory muscles to breath Gastrointestinal:   No nausea, no vomiting.  No diarrhea.  No constipation. Musculoskeletal: Negative for musculoskeletal pain. Skin: Patient has urticaria.    ____________________________________________   PHYSICAL EXAM:  VITAL SIGNS: ED Triage Vitals  Enc Vitals Group     BP 04/21/19 1517 104/70     Pulse Rate 04/21/19 1517 77     Resp 04/21/19 1517 18     Temp 04/21/19 1524 98.2 F (36.8 C)     Temp Source 04/21/19 1517 Oral     SpO2 04/21/19 1517 100 %     Weight 04/21/19 1520 167 lb (75.8 kg)     Height 04/21/19 1520 4\' 9"  (1.448 m)     Head Circumference --      Peak Flow --      Pain Score 04/21/19 1519 0     Pain Loc --      Pain Edu? --      Excl. in [place]? --      Constitutional: Alert and oriented. Well appearing and in no acute distress. Eyes: Conjunctivae are normal. PERRL. EOMI. Head: Atraumatic. ENT:  Ears:       Nose: No congestion/rhinnorhea.      Mouth/Throat: Mucous membranes are moist.  Neck: No stridor.  No cervical spine tenderness to palpation. Cardiovascular: Normal rate, regular rhythm. Normal S1 and S2.  Good peripheral circulation. Respiratory: Normal respiratory effort without tachypnea or retractions. Lungs CTAB. Good air entry to the bases with no decreased or absent breath sounds Gastrointestinal: Bowel sounds x 4 quadrants. Soft and nontender to palpation. No guarding or rigidity. No distention. Musculoskeletal: Full range of motion to all extremities. No obvious  deformities noted Neurologic:  Normal for age. No gross focal neurologic deficits are appreciated.  Skin: Patient has improving urticaria of upper extremities, chest and back. Psychiatric: Mood and affect are normal for age. Speech and behavior are normal.   ____________________________________________   LABS (all labs ordered are listed, but only abnormal results are displayed)  Labs Reviewed - No data to display ____________________________________________  EKG   ____________________________________________  RADIOLOGY  No results found.  ____________________________________________    PROCEDURES  Procedure(s) performed:     Procedures     Medications  predniSONE (DELTASONE) tablet 60 mg (has no administration in time range)  diphenhydrAMINE (BENADRYL) capsule 25 mg (has no administration in time range)  famotidine (PEPCID) tablet 40 mg (has no administration in time range)     ____________________________________________   INITIAL IMPRESSION / ASSESSMENT AND PLAN / ED COURSE  Pertinent labs & imaging results that were available during my care of the patient were reviewed by me and considered in my medical decision making (see chart for details).      Assessment and Plan: Urticaria 49 year old female presents to the emergency department with urticaria of the chest, back and arms after taking a weight loss medication.  She was given prednisone, famotidine and Benadryl in the emergency department today.  Patient was discharged with Benadryl, famotidine and Pepcid.  She was given strict return precautions to return with shortness of breath, chest tightness, chest pain, emesis or diarrhea.  All patient questions were answered. ____________________________________________  FINAL CLINICAL IMPRESSION(S) / ED DIAGNOSES  Final diagnoses:  Urticaria      NEW MEDICATIONS STARTED DURING THIS VISIT:  ED Discharge Orders    None          This chart  was dictated using voice recognition software/Dragon. Despite best efforts to proofread, errors can occur which can change the meaning. Any change was purely unintentional.     Orvil Feil, PA-C 04/21/19 1627    Dionne Bucy, MD 04/21/19 1755

## 2023-03-04 ENCOUNTER — Emergency Department: Payer: Self-pay

## 2023-03-04 ENCOUNTER — Other Ambulatory Visit: Payer: Self-pay

## 2023-03-04 ENCOUNTER — Emergency Department
Admission: EM | Admit: 2023-03-04 | Discharge: 2023-03-04 | Disposition: A | Discharge status: Home / Self Care | Payer: Self-pay | Attending: Emergency Medicine | Admitting: Emergency Medicine

## 2023-03-04 DIAGNOSIS — R55 Syncope and collapse: Secondary | ICD-10-CM | POA: Insufficient documentation

## 2023-03-04 DIAGNOSIS — Y92410 Unspecified street and highway as the place of occurrence of the external cause: Secondary | ICD-10-CM | POA: Diagnosis not present

## 2023-03-04 DIAGNOSIS — S299XXA Unspecified injury of thorax, initial encounter: Secondary | ICD-10-CM | POA: Diagnosis present

## 2023-03-04 DIAGNOSIS — S2222XA Fracture of body of sternum, initial encounter for closed fracture: Secondary | ICD-10-CM | POA: Insufficient documentation

## 2023-03-04 LAB — COMPREHENSIVE METABOLIC PANEL WITH GFR
ALT: 108 U/L — ABNORMAL HIGH (ref 0–44)
AST: 46 U/L — ABNORMAL HIGH (ref 15–41)
Albumin: 4.4 g/dL (ref 3.5–5.0)
Alkaline Phosphatase: 89 U/L (ref 38–126)
Anion gap: 9 (ref 5–15)
BUN: 15 mg/dL (ref 6–20)
CO2: 25 mmol/L (ref 22–32)
Calcium: 8.9 mg/dL (ref 8.9–10.3)
Chloride: 101 mmol/L (ref 98–111)
Creatinine, Ser: 0.52 mg/dL (ref 0.44–1.00)
GFR, Estimated: >60 mL/min
Glucose, Bld: 126 mg/dL — ABNORMAL HIGH (ref 70–99)
Potassium: 3.5 mmol/L (ref 3.5–5.1)
Sodium: 135 mmol/L (ref 135–145)
Total Bilirubin: 0.6 mg/dL
Total Protein: 7.4 g/dL (ref 6.5–8.1)

## 2023-03-04 LAB — URINALYSIS, ROUTINE W REFLEX MICROSCOPIC
Bilirubin Urine: NEGATIVE
Glucose, UA: NEGATIVE mg/dL
Hgb urine dipstick: NEGATIVE
Ketones, ur: NEGATIVE mg/dL
Leukocytes,Ua: NEGATIVE
Nitrite: POSITIVE — AB
Protein, ur: NEGATIVE mg/dL
Specific Gravity, Urine: 1.013 (ref 1.005–1.030)
pH: 5 (ref 5.0–8.0)

## 2023-03-04 LAB — CBC WITH DIFFERENTIAL/PLATELET
Abs Immature Granulocytes: 0.09 10*3/uL — ABNORMAL HIGH (ref 0.00–0.07)
Basophils Absolute: 0.1 10*3/uL (ref 0.0–0.1)
Basophils Relative: 1 %
Eosinophils Absolute: 0.1 10*3/uL (ref 0.0–0.5)
Eosinophils Relative: 1 %
HCT: 41.4 % (ref 36.0–46.0)
Hemoglobin: 15.3 g/dL — ABNORMAL HIGH (ref 12.0–15.0)
Immature Granulocytes: 1 %
Lymphocytes Relative: 18 %
Lymphs Abs: 1.7 10*3/uL (ref 0.7–4.0)
MCH: 33 pg (ref 26.0–34.0)
MCHC: 37 g/dL — ABNORMAL HIGH (ref 30.0–36.0)
MCV: 89.4 fL (ref 80.0–100.0)
Monocytes Absolute: 0.7 10*3/uL (ref 0.1–1.0)
Monocytes Relative: 7 %
Neutro Abs: 7 10*3/uL (ref 1.7–7.7)
Neutrophils Relative %: 72 %
Platelets: 282 10*3/uL (ref 150–400)
RBC: 4.63 MIL/uL (ref 3.87–5.11)
RDW: 11.7 % (ref 11.5–15.5)
WBC: 9.7 10*3/uL (ref 4.0–10.5)
nRBC: 0 % (ref 0.0–0.2)

## 2023-03-04 LAB — POC URINE PREG, ED: Preg Test, Ur: NEGATIVE

## 2023-03-04 LAB — TROPONIN I (HIGH SENSITIVITY)
Troponin I (High Sensitivity): 5 ng/L (ref <18)
Troponin I (High Sensitivity): 5 ng/L

## 2023-03-04 MED ORDER — IOHEXOL 300 MG/ML  SOLN
100.0000 mL | Freq: Once | INTRAMUSCULAR | Status: AC | PRN
Start: 2023-03-04 — End: 2023-03-04
  Administered 2023-03-04: 100 mL via INTRAVENOUS

## 2023-03-04 MED ORDER — OXYCODONE HCL 5 MG PO TABS: 5.0000 mg | ORAL_TABLET | Freq: Four times a day (QID) | ORAL | 0 refills | Status: AC | PRN

## 2023-03-04 MED ORDER — OXYCODONE HCL 5 MG PO TABS
5.0000 mg | ORAL_TABLET | Freq: Once | ORAL | Status: AC
  Administered 2023-03-04: 5 mg via ORAL
  Filled 2023-03-04: qty 1

## 2023-03-04 NOTE — Discharge Instructions (Addendum)
Take ibuprofen 600 every 8 hours for 1 week. Use the oxycodone for pain.   Do not drive or work while on this.  This fracture will heal on its own.  She can follow-up with your primary care doctor or return to the ER for shortness of breath worsening symptoms or any other concerns  Her liver tests were slightly elevated so she should limit her Tylenol use to 2 g in a day.  It looks like she has had this previously and she should have this followed up with her primary care doctor.  IMPRESSION: 1. Age-indeterminate, likely acute, nondisplaced sternal body fracture. 2. No acute intrathoracic, intra-abdominal, intrapelvic traumatic injury. 3. No acute fracture or traumatic malalignment of the thoracic or lumbar spine.   Other imaging findings of potential clinical significance:   1. Hepatic steatosis and hepatomegaly. 2. Colonic diverticulosis with no acute diverticulitis. 3. 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 4. Status post cholecystectomy. 5. Likely right shoulder calcific tendinosis.
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
## 2023-03-04 NOTE — ED Notes (Signed)
Pt offered pain medication. Pt states that she is ok right now but will let RN know when pain worsens. Call light within reach. Family at bedside. Pt informed that we needed a urine sample when she gets the urge.
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
